# Patient Record
Sex: Female | Born: 1970
Health system: Southern US, Community
[De-identification: ages and names within clinical notes are randomized; demographics above are authoritative.]

## PROBLEM LIST (undated history)

## (undated) DIAGNOSIS — I10 Essential (primary) hypertension: Secondary | ICD-10-CM

## (undated) DIAGNOSIS — E079 Disorder of thyroid, unspecified: Secondary | ICD-10-CM

## (undated) DIAGNOSIS — J45909 Unspecified asthma, uncomplicated: Secondary | ICD-10-CM

## (undated) HISTORY — DX: Essential (primary) hypertension: I10

## (undated) HISTORY — PX: CHOLECYSTECTOMY: SHX55

---

## 2004-11-21 ENCOUNTER — Ambulatory Visit: Payer: Self-pay | Admitting: Internal Medicine

## 2004-11-26 ENCOUNTER — Ambulatory Visit: Payer: Self-pay | Admitting: Internal Medicine

## 2014-01-18 DIAGNOSIS — E042 Nontoxic multinodular goiter: Secondary | ICD-10-CM | POA: Insufficient documentation

## 2014-01-18 DIAGNOSIS — R5383 Other fatigue: Secondary | ICD-10-CM | POA: Insufficient documentation

## 2014-01-18 HISTORY — DX: Nontoxic multinodular goiter: E04.2

## 2014-01-18 HISTORY — DX: Other fatigue: R53.83

## 2014-03-08 DIAGNOSIS — Z87442 Personal history of urinary calculi: Secondary | ICD-10-CM | POA: Insufficient documentation

## 2014-03-08 DIAGNOSIS — Z9889 Other specified postprocedural states: Secondary | ICD-10-CM | POA: Insufficient documentation

## 2014-03-08 DIAGNOSIS — Z9049 Acquired absence of other specified parts of digestive tract: Secondary | ICD-10-CM | POA: Insufficient documentation

## 2014-03-08 HISTORY — DX: Acquired absence of other specified parts of digestive tract: Z90.49

## 2014-03-08 HISTORY — DX: Other specified postprocedural states: Z98.890

## 2014-03-08 HISTORY — DX: Personal history of urinary calculi: Z87.442

## 2015-01-30 DIAGNOSIS — M545 Low back pain, unspecified: Secondary | ICD-10-CM

## 2015-01-30 DIAGNOSIS — M533 Sacrococcygeal disorders, not elsewhere classified: Secondary | ICD-10-CM

## 2015-01-30 HISTORY — DX: Low back pain, unspecified: M54.50

## 2015-01-30 HISTORY — DX: Sacrococcygeal disorders, not elsewhere classified: M53.3

## 2015-02-21 DIAGNOSIS — M5126 Other intervertebral disc displacement, lumbar region: Secondary | ICD-10-CM | POA: Insufficient documentation

## 2015-02-21 DIAGNOSIS — M5127 Other intervertebral disc displacement, lumbosacral region: Secondary | ICD-10-CM

## 2015-02-21 HISTORY — DX: Other intervertebral disc displacement, lumbar region: M51.26

## 2015-02-21 HISTORY — DX: Other intervertebral disc displacement, lumbosacral region: M51.27

## 2015-12-03 DIAGNOSIS — R928 Other abnormal and inconclusive findings on diagnostic imaging of breast: Secondary | ICD-10-CM | POA: Insufficient documentation

## 2015-12-03 HISTORY — DX: Other abnormal and inconclusive findings on diagnostic imaging of breast: R92.8

## 2016-03-12 DIAGNOSIS — N921 Excessive and frequent menstruation with irregular cycle: Secondary | ICD-10-CM

## 2016-03-12 DIAGNOSIS — E079 Disorder of thyroid, unspecified: Secondary | ICD-10-CM | POA: Insufficient documentation

## 2016-03-12 DIAGNOSIS — N939 Abnormal uterine and vaginal bleeding, unspecified: Secondary | ICD-10-CM | POA: Insufficient documentation

## 2016-03-12 DIAGNOSIS — Z9889 Other specified postprocedural states: Secondary | ICD-10-CM | POA: Insufficient documentation

## 2016-03-12 DIAGNOSIS — N930 Postcoital and contact bleeding: Secondary | ICD-10-CM

## 2016-03-12 HISTORY — DX: Other specified postprocedural states: Z98.890

## 2016-03-12 HISTORY — DX: Disorder of thyroid, unspecified: E07.9

## 2016-03-12 HISTORY — DX: Postcoital and contact bleeding: N93.0

## 2016-03-12 HISTORY — DX: Abnormal uterine and vaginal bleeding, unspecified: N93.9

## 2016-03-12 HISTORY — DX: Excessive and frequent menstruation with irregular cycle: N92.1

## 2017-06-19 DIAGNOSIS — E063 Autoimmune thyroiditis: Secondary | ICD-10-CM | POA: Insufficient documentation

## 2017-06-19 HISTORY — DX: Autoimmune thyroiditis: E06.3

## 2017-11-09 DIAGNOSIS — N6012 Diffuse cystic mastopathy of left breast: Secondary | ICD-10-CM

## 2017-11-09 HISTORY — DX: Diffuse cystic mastopathy of left breast: N60.12

## 2018-07-09 DIAGNOSIS — J4521 Mild intermittent asthma with (acute) exacerbation: Secondary | ICD-10-CM | POA: Insufficient documentation

## 2018-07-09 DIAGNOSIS — E559 Vitamin D deficiency, unspecified: Secondary | ICD-10-CM | POA: Insufficient documentation

## 2018-07-09 HISTORY — DX: Vitamin D deficiency, unspecified: E55.9

## 2018-07-09 HISTORY — DX: Mild intermittent asthma with (acute) exacerbation: J45.21

## 2018-09-14 DIAGNOSIS — E063 Autoimmune thyroiditis: Secondary | ICD-10-CM | POA: Diagnosis not present

## 2018-09-14 DIAGNOSIS — I10 Essential (primary) hypertension: Secondary | ICD-10-CM | POA: Diagnosis not present

## 2018-09-14 DIAGNOSIS — I38 Endocarditis, valve unspecified: Secondary | ICD-10-CM | POA: Diagnosis not present

## 2018-10-05 MED FILL — LABETALOL HCL 100 MG TABS: 100 | 30 days supply | Qty: 270 | Fill #0

## 2018-10-07 MED FILL — NORETHINDRONE 5 MG TABLET: 5 | 30 days supply | Qty: 30 | Fill #0

## 2018-10-15 MED FILL — NORETHINDRONE 5 MG TABLET: 5 | 30 days supply | Qty: 30 | Fill #0

## 2018-10-15 MED FILL — LABETALOL HCL 100 MG TABS: 100 | 30 days supply | Qty: 270 | Fill #0

## 2018-10-19 DIAGNOSIS — Z1231 Encounter for screening mammogram for malignant neoplasm of breast: Secondary | ICD-10-CM | POA: Diagnosis not present

## 2018-10-19 DIAGNOSIS — Z1239 Encounter for other screening for malignant neoplasm of breast: Secondary | ICD-10-CM | POA: Diagnosis not present

## 2018-10-20 DIAGNOSIS — N921 Excessive and frequent menstruation with irregular cycle: Secondary | ICD-10-CM | POA: Diagnosis not present

## 2018-10-20 DIAGNOSIS — Z124 Encounter for screening for malignant neoplasm of cervix: Secondary | ICD-10-CM | POA: Diagnosis not present

## 2018-10-20 DIAGNOSIS — Z1151 Encounter for screening for human papillomavirus (HPV): Secondary | ICD-10-CM | POA: Diagnosis not present

## 2018-10-20 DIAGNOSIS — Z01419 Encounter for gynecological examination (general) (routine) without abnormal findings: Secondary | ICD-10-CM | POA: Diagnosis not present

## 2018-11-08 DIAGNOSIS — I38 Endocarditis, valve unspecified: Secondary | ICD-10-CM | POA: Diagnosis not present

## 2018-11-08 DIAGNOSIS — I10 Essential (primary) hypertension: Secondary | ICD-10-CM | POA: Diagnosis not present

## 2018-11-08 DIAGNOSIS — E063 Autoimmune thyroiditis: Secondary | ICD-10-CM | POA: Diagnosis not present

## 2018-11-12 MED FILL — NORETHINDRONE 5 MG TABLET: 5 | 90 days supply | Qty: 90 | Fill #0

## 2018-11-17 MED FILL — LABETALOL HCL 100 MG TABS: 100 | 30 days supply | Qty: 270 | Fill #1

## 2018-11-17 MED FILL — VIT D2 1.25 MG (50,000 UNIT: 1.25 MG | 28 days supply | Qty: 4 | Fill #0

## 2018-12-13 MED FILL — VIT D2 1.25 MG (50,000 UNIT: 1.25 MG | 28 days supply | Qty: 4 | Fill #1

## 2018-12-13 MED FILL — LABETALOL HCL 100 MG TABS: 100 | 30 days supply | Qty: 270 | Fill #2

## 2018-12-31 MED FILL — LABETALOL HCL 100 MG TABS: 100 | 30 days supply | Qty: 270 | Fill #3

## 2019-01-29 MED FILL — VIT D2 1.25 MG (50,000 UNIT: 1.25 MG | 28 days supply | Qty: 4 | Fill #0

## 2019-01-31 MED FILL — LABETALOL HCL 100 MG TABS: 100 | 30 days supply | Qty: 270 | Fill #4

## 2019-02-02 DIAGNOSIS — J90 Pleural effusion, not elsewhere classified: Secondary | ICD-10-CM | POA: Diagnosis not present

## 2019-02-02 DIAGNOSIS — R509 Fever, unspecified: Secondary | ICD-10-CM | POA: Diagnosis not present

## 2019-02-02 DIAGNOSIS — R0789 Other chest pain: Secondary | ICD-10-CM | POA: Diagnosis not present

## 2019-02-02 DIAGNOSIS — J4521 Mild intermittent asthma with (acute) exacerbation: Secondary | ICD-10-CM | POA: Diagnosis not present

## 2019-02-02 DIAGNOSIS — R109 Unspecified abdominal pain: Secondary | ICD-10-CM | POA: Diagnosis not present

## 2019-02-02 DIAGNOSIS — Z79899 Other long term (current) drug therapy: Secondary | ICD-10-CM | POA: Diagnosis not present

## 2019-02-02 DIAGNOSIS — R51 Headache: Secondary | ICD-10-CM | POA: Diagnosis not present

## 2019-02-02 DIAGNOSIS — R0602 Shortness of breath: Secondary | ICD-10-CM | POA: Diagnosis not present

## 2019-02-02 DIAGNOSIS — I10 Essential (primary) hypertension: Secondary | ICD-10-CM | POA: Diagnosis not present

## 2019-02-02 DIAGNOSIS — Z20828 Contact with and (suspected) exposure to other viral communicable diseases: Secondary | ICD-10-CM | POA: Diagnosis not present

## 2019-02-03 DIAGNOSIS — I10 Essential (primary) hypertension: Secondary | ICD-10-CM | POA: Diagnosis not present

## 2019-02-03 DIAGNOSIS — R0789 Other chest pain: Secondary | ICD-10-CM | POA: Diagnosis not present

## 2019-02-03 DIAGNOSIS — R079 Chest pain, unspecified: Secondary | ICD-10-CM | POA: Diagnosis not present

## 2019-02-03 DIAGNOSIS — Z79899 Other long term (current) drug therapy: Secondary | ICD-10-CM | POA: Diagnosis not present

## 2019-02-03 DIAGNOSIS — J90 Pleural effusion, not elsewhere classified: Secondary | ICD-10-CM | POA: Diagnosis not present

## 2019-02-03 DIAGNOSIS — Z20828 Contact with and (suspected) exposure to other viral communicable diseases: Secondary | ICD-10-CM | POA: Diagnosis not present

## 2019-02-03 DIAGNOSIS — R509 Fever, unspecified: Secondary | ICD-10-CM | POA: Diagnosis not present

## 2019-02-03 DIAGNOSIS — R0602 Shortness of breath: Secondary | ICD-10-CM | POA: Diagnosis not present

## 2019-02-03 DIAGNOSIS — J4521 Mild intermittent asthma with (acute) exacerbation: Secondary | ICD-10-CM | POA: Diagnosis not present

## 2019-02-03 MED FILL — FLOVENT HFA 44 MCG INHALER: 44 | 30 days supply | Qty: 11 | Fill #0

## 2019-02-03 MED FILL — predniSONE 20 MG TABS: 20 | 5 days supply | Qty: 5 | Fill #0

## 2019-02-14 MED FILL — NORETHINDRONE 5 MG TABLET: 5 | 90 days supply | Qty: 90 | Fill #1

## 2019-02-23 MED FILL — LABETALOL HCL 200 MG TABLET: 200 | 30 days supply | Qty: 180 | Fill #0

## 2019-03-21 MED FILL — LABETALOL HCL 200 MG TABLET: 200 | 30 days supply | Qty: 180 | Fill #0

## 2019-03-23 MED FILL — VIT D2 1.25 MG (50,000 UNIT: 1.25 MG | 28 days supply | Qty: 4 | Fill #1

## 2019-03-31 DIAGNOSIS — Z03818 Encounter for observation for suspected exposure to other biological agents ruled out: Secondary | ICD-10-CM | POA: Diagnosis not present

## 2019-04-04 DIAGNOSIS — Z760 Encounter for issue of repeat prescription: Secondary | ICD-10-CM | POA: Diagnosis not present

## 2019-04-04 DIAGNOSIS — E042 Nontoxic multinodular goiter: Secondary | ICD-10-CM | POA: Diagnosis not present

## 2019-04-04 DIAGNOSIS — R5383 Other fatigue: Secondary | ICD-10-CM | POA: Diagnosis not present

## 2019-04-04 DIAGNOSIS — Z6827 Body mass index (BMI) 27.0-27.9, adult: Secondary | ICD-10-CM | POA: Diagnosis not present

## 2019-04-04 DIAGNOSIS — R7989 Other specified abnormal findings of blood chemistry: Secondary | ICD-10-CM | POA: Diagnosis not present

## 2019-04-04 DIAGNOSIS — E559 Vitamin D deficiency, unspecified: Secondary | ICD-10-CM | POA: Diagnosis not present

## 2019-04-11 MED FILL — traMADol HCL 50 MG TABS: 50 | 1 days supply | Qty: 4 | Fill #0

## 2019-04-20 DIAGNOSIS — N926 Irregular menstruation, unspecified: Secondary | ICD-10-CM | POA: Diagnosis not present

## 2019-04-20 DIAGNOSIS — N946 Dysmenorrhea, unspecified: Secondary | ICD-10-CM | POA: Diagnosis not present

## 2019-04-20 DIAGNOSIS — G8929 Other chronic pain: Secondary | ICD-10-CM | POA: Diagnosis not present

## 2019-04-20 DIAGNOSIS — O10019 Pre-existing essential hypertension complicating pregnancy, unspecified trimester: Secondary | ICD-10-CM | POA: Diagnosis not present

## 2019-04-20 DIAGNOSIS — M549 Dorsalgia, unspecified: Secondary | ICD-10-CM | POA: Diagnosis not present

## 2019-04-22 DIAGNOSIS — N3091 Cystitis, unspecified with hematuria: Secondary | ICD-10-CM | POA: Diagnosis not present

## 2019-04-22 MED FILL — CIPROFLOXACIN HCL 500 MG TA: 500 | 7 days supply | Qty: 14 | Fill #0

## 2019-04-25 MED FILL — LABETALOL HCL 200 MG TABLET: 200 | 30 days supply | Qty: 180 | Fill #0

## 2019-05-10 DIAGNOSIS — I10 Essential (primary) hypertension: Secondary | ICD-10-CM | POA: Diagnosis not present

## 2019-05-10 DIAGNOSIS — I38 Endocarditis, valve unspecified: Secondary | ICD-10-CM | POA: Diagnosis not present

## 2019-05-10 DIAGNOSIS — E063 Autoimmune thyroiditis: Secondary | ICD-10-CM | POA: Diagnosis not present

## 2019-05-10 MED FILL — cloNIDine HCL 0.1 MG TABS: 0.1 | 30 days supply | Qty: 60 | Fill #0

## 2019-05-17 DIAGNOSIS — N898 Other specified noninflammatory disorders of vagina: Secondary | ICD-10-CM | POA: Diagnosis not present

## 2019-05-17 DIAGNOSIS — N951 Menopausal and female climacteric states: Secondary | ICD-10-CM | POA: Diagnosis not present

## 2019-05-17 DIAGNOSIS — R399 Unspecified symptoms and signs involving the genitourinary system: Secondary | ICD-10-CM | POA: Diagnosis not present

## 2019-05-17 DIAGNOSIS — N921 Excessive and frequent menstruation with irregular cycle: Secondary | ICD-10-CM | POA: Diagnosis not present

## 2019-05-17 DIAGNOSIS — N946 Dysmenorrhea, unspecified: Secondary | ICD-10-CM | POA: Diagnosis not present

## 2019-05-17 DIAGNOSIS — N926 Irregular menstruation, unspecified: Secondary | ICD-10-CM | POA: Diagnosis not present

## 2019-05-18 MED FILL — CEPHALEXIN 500 MG CAPSULE: 500 | 7 days supply | Qty: 21 | Fill #0

## 2019-05-23 MED FILL — VIT D2 1.25 MG (50,000 UNIT: 1.25 MG | 84 days supply | Qty: 12 | Fill #0

## 2019-05-24 MED FILL — LABETALOL HCL 200 MG TABLET: 200 | 30 days supply | Qty: 180 | Fill #0

## 2019-05-30 DIAGNOSIS — I340 Nonrheumatic mitral (valve) insufficiency: Secondary | ICD-10-CM | POA: Diagnosis not present

## 2019-06-02 DIAGNOSIS — E042 Nontoxic multinodular goiter: Secondary | ICD-10-CM | POA: Diagnosis not present

## 2019-06-08 DIAGNOSIS — R351 Nocturia: Secondary | ICD-10-CM | POA: Diagnosis not present

## 2019-06-08 DIAGNOSIS — R350 Frequency of micturition: Secondary | ICD-10-CM | POA: Diagnosis not present

## 2019-06-08 MED FILL — CIPROFLOXACIN HCL 500 MG TA: 500 | 7 days supply | Qty: 14 | Fill #0

## 2019-06-13 MED FILL — NORETHINDRONE 5 MG TABLET: 5 | 90 days supply | Qty: 90 | Fill #2

## 2019-06-20 MED FILL — LABETALOL HCL 200 MG TABLET: 200 | 30 days supply | Qty: 180 | Fill #1

## 2019-07-18 MED FILL — LABETALOL HCL 200 MG TABLET: 200 | 30 days supply | Qty: 180 | Fill #2

## 2019-08-15 DIAGNOSIS — E559 Vitamin D deficiency, unspecified: Secondary | ICD-10-CM | POA: Diagnosis not present

## 2019-08-15 DIAGNOSIS — E042 Nontoxic multinodular goiter: Secondary | ICD-10-CM | POA: Diagnosis not present

## 2019-08-15 DIAGNOSIS — Z760 Encounter for issue of repeat prescription: Secondary | ICD-10-CM | POA: Diagnosis not present

## 2019-08-15 MED FILL — VIT D2 1.25 MG (50,000 UNIT: 1.25 MG | 84 days supply | Qty: 12 | Fill #0

## 2019-08-17 MED FILL — LABETALOL HCL 200 MG TABS: 200 | 30 days supply | Qty: 180 | Fill #3

## 2019-08-17 MED FILL — cloNIDine HCL 0.1 MG TABS: 0.1 | 30 days supply | Qty: 60 | Fill #1

## 2019-08-18 MED FILL — PHENTERMINE 37.5 MG TABLET: 37.5 | 60 days supply | Qty: 60 | Fill #0

## 2019-08-25 DIAGNOSIS — Z20828 Contact with and (suspected) exposure to other viral communicable diseases: Secondary | ICD-10-CM | POA: Diagnosis not present

## 2019-08-25 DIAGNOSIS — B349 Viral infection, unspecified: Secondary | ICD-10-CM | POA: Diagnosis not present

## 2019-08-25 DIAGNOSIS — R079 Chest pain, unspecified: Secondary | ICD-10-CM | POA: Diagnosis not present

## 2019-08-25 DIAGNOSIS — J45909 Unspecified asthma, uncomplicated: Secondary | ICD-10-CM | POA: Diagnosis not present

## 2019-08-25 DIAGNOSIS — R072 Precordial pain: Secondary | ICD-10-CM | POA: Diagnosis not present

## 2019-08-25 DIAGNOSIS — R300 Dysuria: Secondary | ICD-10-CM | POA: Diagnosis not present

## 2019-08-25 DIAGNOSIS — R509 Fever, unspecified: Secondary | ICD-10-CM | POA: Diagnosis not present

## 2019-08-25 DIAGNOSIS — R5383 Other fatigue: Secondary | ICD-10-CM | POA: Diagnosis not present

## 2019-08-25 DIAGNOSIS — R0602 Shortness of breath: Secondary | ICD-10-CM | POA: Diagnosis not present

## 2019-08-26 ENCOUNTER — Other Ambulatory Visit: Payer: Self-pay

## 2019-08-26 DIAGNOSIS — B349 Viral infection, unspecified: Secondary | ICD-10-CM | POA: Insufficient documentation

## 2019-08-26 DIAGNOSIS — R7989 Other specified abnormal findings of blood chemistry: Secondary | ICD-10-CM | POA: Diagnosis not present

## 2019-08-26 DIAGNOSIS — R5383 Other fatigue: Secondary | ICD-10-CM | POA: Diagnosis not present

## 2019-08-26 DIAGNOSIS — R072 Precordial pain: Secondary | ICD-10-CM | POA: Diagnosis not present

## 2019-08-26 DIAGNOSIS — J45909 Unspecified asthma, uncomplicated: Secondary | ICD-10-CM | POA: Insufficient documentation

## 2019-08-26 DIAGNOSIS — R0602 Shortness of breath: Secondary | ICD-10-CM | POA: Diagnosis not present

## 2019-08-26 DIAGNOSIS — R079 Chest pain, unspecified: Secondary | ICD-10-CM | POA: Diagnosis not present

## 2019-08-26 DIAGNOSIS — R05 Cough: Secondary | ICD-10-CM | POA: Diagnosis not present

## 2019-08-26 DIAGNOSIS — R509 Fever, unspecified: Secondary | ICD-10-CM | POA: Diagnosis not present

## 2019-08-26 NOTE — ED Triage Notes (Signed)
Pt. Said she has had chest pain for 2-3 days and is on diet supplement and HTN meds  With her B/P still going up.  Pt. Having chest pressure with her breathing feeling not.   Yesterday she had EKG with abnormalities per urgent care but normal one at [facility] last night.  Went to ED at [facility] due to feeling the Chest Pressure again and getting worse.  Troponin done on Pt. X 2 were WNL.  No D Dimer done on the Pt.

## 2019-08-27 ENCOUNTER — Emergency Department (HOSPITAL_BASED_OUTPATIENT_CLINIC_OR_DEPARTMENT_OTHER)
Admission: EM | Admit: 2019-08-27 | Discharge: 2019-08-27 | Disposition: A | Discharge status: Home / Self Care | Payer: 59 | Attending: Emergency Medicine | Admitting: Emergency Medicine

## 2019-08-27 ENCOUNTER — Emergency Department (HOSPITAL_BASED_OUTPATIENT_CLINIC_OR_DEPARTMENT_OTHER): Payer: 59

## 2019-08-27 ENCOUNTER — Encounter (HOSPITAL_BASED_OUTPATIENT_CLINIC_OR_DEPARTMENT_OTHER): Payer: Self-pay | Admitting: *Deleted

## 2019-08-27 DIAGNOSIS — B349 Viral infection, unspecified: Secondary | ICD-10-CM

## 2019-08-27 DIAGNOSIS — J45909 Unspecified asthma, uncomplicated: Secondary | ICD-10-CM | POA: Diagnosis not present

## 2019-08-27 DIAGNOSIS — R079 Chest pain, unspecified: Secondary | ICD-10-CM | POA: Diagnosis not present

## 2019-08-27 DIAGNOSIS — R05 Cough: Secondary | ICD-10-CM | POA: Diagnosis not present

## 2019-08-27 DIAGNOSIS — R0602 Shortness of breath: Secondary | ICD-10-CM | POA: Diagnosis not present

## 2019-08-27 DIAGNOSIS — R7989 Other specified abnormal findings of blood chemistry: Secondary | ICD-10-CM | POA: Diagnosis not present

## 2019-08-27 HISTORY — DX: Unspecified asthma, uncomplicated: J45.909

## 2019-08-27 HISTORY — DX: Disorder of thyroid, unspecified: E07.9

## 2019-08-27 LAB — CBC WITH DIFFERENTIAL/PLATELET
Abs Immature Granulocytes: 0.02 10*3/uL (ref 0.00–0.07)
Basophils Absolute: 0.1 10*3/uL (ref 0.0–0.1)
Basophils Relative: 1 %
Eosinophils Absolute: 0.2 10*3/uL (ref 0.0–0.5)
Eosinophils Relative: 3 %
HCT: 39.4 % (ref 36.0–46.0)
Hemoglobin: 13 g/dL (ref 12.0–15.0)
Immature Granulocytes: 0 %
Lymphocytes Relative: 27 %
Lymphs Abs: 1.9 10*3/uL (ref 0.7–4.0)
MCH: 28.6 pg (ref 26.0–34.0)
MCHC: 33 g/dL (ref 30.0–36.0)
MCV: 86.8 fL (ref 80.0–100.0)
Monocytes Absolute: 0.7 10*3/uL (ref 0.1–1.0)
Monocytes Relative: 9 %
Neutro Abs: 4.4 10*3/uL (ref 1.7–7.7)
Neutrophils Relative %: 60 %
Platelets: 218 10*3/uL (ref 150–400)
RBC: 4.54 MIL/uL (ref 3.87–5.11)
RDW: 13 % (ref 11.5–15.5)
WBC: 7.2 10*3/uL (ref 4.0–10.5)
nRBC: 0 % (ref 0.0–0.2)

## 2019-08-27 LAB — COMPREHENSIVE METABOLIC PANEL
ALT: 27 U/L (ref 0–44)
AST: 20 U/L (ref 15–41)
Albumin: 4.1 g/dL (ref 3.5–5.0)
Alkaline Phosphatase: 53 U/L (ref 38–126)
Anion gap: 7 (ref 5–15)
BUN: 16 mg/dL (ref 6–20)
CO2: 21 mmol/L — ABNORMAL LOW (ref 22–32)
Calcium: 9.2 mg/dL (ref 8.9–10.3)
Chloride: 109 mmol/L (ref 98–111)
Creatinine, Ser: 0.73 mg/dL (ref 0.44–1.00)
GFR calc Af Amer: >60 mL/min (ref >60)
GFR calc non Af Amer: >60 mL/min (ref >60)
Glucose, Bld: 118 mg/dL — ABNORMAL HIGH (ref 70–99)
Potassium: 4.1 mmol/L (ref 3.5–5.1)
Sodium: 137 mmol/L (ref 135–145)
Total Bilirubin: 0.4 mg/dL (ref 0.3–1.2)
Total Protein: 7.1 g/dL (ref 6.5–8.1)

## 2019-08-27 LAB — LIPASE, BLOOD: Lipase: 33 U/L (ref 11–51)

## 2019-08-27 LAB — TROPONIN I (HIGH SENSITIVITY): Troponin I (High Sensitivity): <2 ng/L (ref <18)

## 2019-08-27 LAB — D-DIMER, QUANTITATIVE: D-Dimer, Quant: 1.28 ug/mL-FEU — ABNORMAL HIGH (ref 0.00–0.50)

## 2019-08-27 MED ORDER — IOHEXOL 350 MG/ML SOLN
100.0000 mL | Freq: Once | INTRAVENOUS | Status: AC | PRN
  Administered 2019-08-27: 100 mL via INTRAVENOUS

## 2019-08-27 MED ORDER — DEXAMETHASONE 6 MG PO TABS
10.0000 mg | ORAL_TABLET | Freq: Once | ORAL | Status: AC
  Administered 2019-08-27: 10 mg via ORAL
  Filled 2019-08-27: qty 1

## 2019-08-27 NOTE — ED Notes (Signed)
ED Provider at bedside. 

## 2019-08-27 NOTE — Discharge Instructions (Signed)
Take tylenol 2 pills 4 times a day and motrin 4 pills 3 times a day.  Drink plenty of fluids.  Return for worsening shortness of breath, headache, confusion. Follow up with your family doctor.      Person Under Monitoring Name: Lindsay Burke  Location: [address] [place] [address]   Infection Prevention Recommendations for Individuals Confirmed to have, or Being Evaluated for, 2019 Novel Coronavirus (COVID-19) Infection Who Receive Care at Home  Individuals who are confirmed to have, or are being evaluated for, COVID-19 should follow the prevention steps below until a healthcare provider or local or state health department says they can return to normal activities.  Stay home except to get medical care You should restrict activities outside your home, except for getting medical care. Do not go to work, school, or public areas, and do not use public transportation or taxis.  Call ahead before visiting your doctor Before your medical appointment, call the healthcare provider and tell them that you have, or are being evaluated for, COVID-19 infection. This will help the healthcare providers office take steps to keep other people from getting infected. Ask your healthcare provider to call the local or state health department.  Monitor your symptoms Seek prompt medical attention if your illness is worsening (e.g., difficulty breathing). Before going to your medical appointment, call the healthcare provider and tell them that you have, or are being evaluated for, COVID-19 infection. Ask your healthcare provider to call the local or state health department.  Wear a facemask You should wear a facemask that covers your nose and mouth when you are in the same room with other people and when you visit a healthcare provider. People who live with or visit you should also wear a facemask while they are in the same room with you.  Separate yourself from other people in your home As  much as possible, you should stay in a different room from other people in your home. Also, you should use a separate bathroom, if available.  Avoid sharing household items You should not share dishes, drinking glasses, cups, eating utensils, towels, bedding, or other items with other people in your home. After using these items, you should wash them thoroughly with soap and water.  Cover your coughs and sneezes Cover your mouth and nose with a tissue when you cough or sneeze, or you can cough or sneeze into your sleeve. Throw used tissues in a lined trash can, and immediately wash your hands with soap and water for at least 20 seconds or use an alcohol-based hand rub.  Wash your [facility] your hands often and thoroughly with soap and water for at least 20 seconds. You can use an alcohol-based hand sanitizer if soap and water are not available and if your hands are not visibly dirty. Avoid touching your eyes, nose, and mouth with unwashed hands.   Prevention Steps for Caregivers and Household Members of Individuals Confirmed to have, or Being Evaluated for, COVID-19 Infection Being Cared for in the Home  If you live with, or provide care at home for, a person confirmed to have, or being evaluated for, COVID-19 infection please follow these guidelines to prevent infection:  Follow healthcare providers instructions Make sure that you understand and can help the patient follow any healthcare provider instructions for all care.  Provide for the patients basic needs You should help the patient with basic needs in the home and provide support for getting groceries, prescriptions, and  other personal needs.  Monitor the patients symptoms If they are getting sicker, call his or her medical provider and tell them that the patient has, or is being evaluated for, COVID-19 infection. This will help the healthcare providers office take steps to keep other people from getting infected. Ask  the healthcare provider to call the local or state health department.  Limit the number of people who have contact with the patient If possible, have only one caregiver for the patient. Other household members should stay in another home or place of residence. If this is not possible, they should stay in another room, or be separated from the patient as much as possible. Use a separate bathroom, if available. Restrict visitors who do not have an essential need to be in the home.  Keep older adults, very young children, and other sick people away from the patient Keep older adults, very young children, and those who have compromised immune systems or chronic health conditions away from the patient. This includes people with chronic heart, lung, or kidney conditions, diabetes, and cancer.  Ensure good ventilation Make sure that shared spaces in the home have good air flow, such as from an air conditioner or an opened window, weather permitting.  Wash your hands often Wash your hands often and thoroughly with soap and water for at least 20 seconds. You can use an alcohol based hand sanitizer if soap and water are not available and if your hands are not visibly dirty. Avoid touching your eyes, nose, and mouth with unwashed hands. Use disposable paper towels to dry your hands. If not available, use dedicated cloth towels and replace them when they become wet.  Wear a facemask and gloves Wear a disposable facemask at all times in the room and gloves when you touch or have contact with the patients blood, body fluids, and/or secretions or excretions, such as sweat, saliva, sputum, nasal mucus, vomit, urine, or feces.  Ensure the mask fits over your nose and mouth tightly, and do not touch it during use. Throw out disposable facemasks and gloves after using them. Do not reuse. Wash your hands immediately after removing your facemask and gloves. If your personal clothing becomes contaminated,  carefully remove clothing and launder. Wash your hands after handling contaminated clothing. Place all used disposable facemasks, gloves, and other waste in a lined container before disposing them with other household waste. Remove gloves and wash your hands immediately after handling these items.  Do not share dishes, glasses, or other household items with the patient Avoid sharing household items. You should not share dishes, drinking glasses, cups, eating utensils, towels, bedding, or other items with a patient who is confirmed to have, or being evaluated for, COVID-19 infection. After the person uses these items, you should wash them thoroughly with soap and water.  Wash laundry thoroughly Immediately remove and wash clothes or bedding that have blood, body fluids, and/or secretions or excretions, such as sweat, saliva, sputum, nasal mucus, vomit, urine, or feces, on them. Wear gloves when handling laundry from the patient. Read and follow directions on labels of laundry or clothing items and detergent. In general, wash and dry with the warmest temperatures recommended on the label.  Clean all areas the individual has used often Clean all touchable surfaces, such as counters, tabletops, doorknobs, bathroom fixtures, toilets, phones, keyboards, tablets, and bedside tables, every day. Also, clean any surfaces that may have blood, body fluids, and/or secretions or excretions on them. Wear gloves when cleaning  surfaces the patient has come in contact with. Use a diluted bleach solution (e.g., dilute bleach with 1 part bleach and 10 parts water) or a household disinfectant with a label that says EPA-registered for coronaviruses. To make a bleach solution at home, add 1 tablespoon of bleach to 1 quart (4 cups) of water. For a larger supply, add  cup of bleach to 1 gallon (16 cups) of water. Read labels of cleaning products and follow recommendations provided on product labels. Labels contain  instructions for safe and effective use of the cleaning product including precautions you should take when applying the product, such as wearing gloves or eye protection and making sure you have good ventilation during use of the product. Remove gloves and wash hands immediately after cleaning.  Monitor yourself for signs and symptoms of illness Caregivers and household members are considered close contacts, should monitor their health, and will be asked to limit movement outside of the home to the extent possible. Follow the monitoring steps for close contacts listed on the symptom monitoring form.   ? If you have additional questions, contact your local health department or call the epidemiologist on call at [phone] (available 24/7). ? This guidance is subject to change. For the most up-to-date guidance from [facility], please refer to their website: [employer]Blogs[employer]pl

## 2019-08-27 NOTE — ED Provider Notes (Signed)
[facility] Provider Note   CSN: [id number] Arrival date & time: 08/26/19  2350     History   Chief Complaint Chief Complaint  Patient presents with  . Chest Pain    HPI Lindsay Burke is a 48 y.o. female.     48 yo F with a chief complaints of chest pain or shortness of breath.  Going on for about 48 hours.  Started with viral-like illness for about 4 days.  Multiple members of her family have had a similar illness.  Diffuse body aches.  No coughing nausea vomiting or diarrhea.  The history is provided by the patient.  Chest Pain Pain location:  Substernal area Pain quality: aching   Pain radiates to:  Does not radiate Pain severity:  Moderate Onset quality:  Gradual Duration:  2 days Timing:  Constant Progression:  Unchanged Chronicity:  New Relieved by:  Nothing Worsened by:  Nothing Ineffective treatments:  None tried Associated symptoms: shortness of breath   Associated symptoms: no dizziness, no fever, no headache, no nausea, no palpitations and no vomiting     Past Medical History:  Diagnosis Date  . Asthma   . Thyroid disease     There are no active problems to display for this patient.   Past Surgical History:  Procedure Laterality Date  . CHOLECYSTECTOMY       OB History   No obstetric history on file.      Home Medications    Prior to Admission medications   Not on File    Family History No family history on file.  Social History Social History   Tobacco Use  . Smoking status: Never Smoker  . Smokeless tobacco: Never Used  Substance Use Topics  . Alcohol use: Not on file  . Drug use: Not on file     Allergies   Patient has no allergy information on record.   Review of Systems Review of Systems  Constitutional: Negative for chills and fever.  HENT: Negative for congestion and rhinorrhea.   Eyes: Negative for redness and visual disturbance.  Respiratory: Positive for shortness of breath.  Negative for wheezing.   Cardiovascular: Positive for chest pain. Negative for palpitations.  Gastrointestinal: Negative for nausea and vomiting.  Genitourinary: Negative for dysuria and urgency.  Musculoskeletal: Negative for arthralgias and myalgias.  Skin: Negative for pallor and wound.  Neurological: Negative for dizziness and headaches.     Physical Exam Updated Vital Signs BP (!) 142/86   Pulse 71   Temp 98.4 F (36.9 C) (Oral)   Resp (!) 21   Ht 5\' 4"  (1.626 m)   Wt 68.5 kg   LMP  (LMP Unknown) Comment: using progesterone  SpO2 100%   BMI 25.92 kg/m   Physical Exam Vitals signs and nursing note reviewed.  Constitutional:      General: She is not in acute distress.    Appearance: She is well-developed. She is not diaphoretic.  HENT:     Head: Normocephalic and atraumatic.  Eyes:     Pupils: Pupils are equal, round, and reactive to light.  Neck:     Musculoskeletal: Normal range of motion and neck supple.  Cardiovascular:     Rate and Rhythm: Normal rate and regular rhythm.     Heart sounds: No murmur. No friction rub. No gallop.   Pulmonary:     Effort: Pulmonary effort is normal.     Breath sounds: No wheezing or rales.  Abdominal:  General: There is no distension.     Palpations: Abdomen is soft.     Tenderness: There is no abdominal tenderness.  Musculoskeletal:        General: No tenderness.  Skin:    General: Skin is warm and dry.  Neurological:     Mental Status: She is alert and oriented to person, place, and time.  Psychiatric:        Behavior: Behavior normal.      ED Treatments / Results  Labs (all labs ordered are listed, but only abnormal results are displayed) Labs Reviewed  COMPREHENSIVE METABOLIC PANEL - Abnormal; Notable for the following components:      Result Value   CO2 21 (*)    Glucose, Bld 118 (*)    All other components within normal limits  D-DIMER, QUANTITATIVE (NOT AT [facility]) - Abnormal; Notable for the following  components:   D-Dimer, Quant 1.28 (*)    All other components within normal limits  CBC WITH DIFFERENTIAL/PLATELET  LIPASE, BLOOD  TROPONIN I (HIGH SENSITIVITY)    EKG EKG Interpretation  Date/Time:  Friday August 26 2019 23:59:17 EST Ventricular Rate:  82 PR Interval:  156 QRS Duration: 74 QT Interval:  362 QTC Calculation: 422 R Axis:   25 Text Interpretation: Normal sinus rhythm Normal ECG No old tracing to compare Confirmed by Deno Etienne [phone]) on 08/27/2019 12:03:16 AM   Radiology Ct Angio Chest Pe W And/or Wo Contrast  Result Date: 08/27/2019 CLINICAL DATA:  Positive D-dimer.  Chest pain EXAM: CT ANGIOGRAPHY CHEST WITH CONTRAST TECHNIQUE: Multidetector CT imaging of the chest was performed using the standard protocol during bolus administration of intravenous contrast. Multiplanar CT image reconstructions and MIPs were obtained to evaluate the vascular anatomy. CONTRAST:  151mL OMNIPAQUE IOHEXOL 350 MG/ML SOLN COMPARISON:  02/03/2019 FINDINGS: Cardiovascular: Heart is normal size. Aorta is normal caliber. No filling defects in the pulmonary arteries to suggestpulmonary emboli. Mediastinum/Nodes: No mediastinal, hilar, or axillary adenopathy. Trachea and esophagus are unremarkable. Thyroid unremarkable. Lungs/Pleura: Trace bilateral pleural effusions. No confluent opacities. Upper Abdomen: Imaging into the upper abdomen shows no acute findings. Musculoskeletal: Chest wall soft tissues are unremarkable. No acute bony abnormality. Review of the MIP images confirms the above findings. IMPRESSION: No evidence of pulmonary embolus. Trace bilateral pleural effusions. Electronically Signed   By: Rolm Baptise M.D.   On: 08/27/2019 03:19   Dg Chest Port 1 View  Result Date: 08/27/2019 CLINICAL DATA:  Chest pressure, cough, shortness of breath EXAM: PORTABLE CHEST 1 VIEW COMPARISON:  08/25/2019 FINDINGS: Heart and mediastinal contours are within normal limits. No focal opacities or  effusions. No acute bony abnormality. IMPRESSION: No active disease. Electronically Signed   By: Rolm Baptise M.D.   On: 08/27/2019 01:50    Procedures Procedures (including critical care time)  Medications Ordered in ED Medications  iohexol (OMNIPAQUE) 350 MG/ML injection 100 mL (100 mLs Intravenous Contrast Given 08/27/19 0301)  dexamethasone (DECADRON) tablet 10 mg (10 mg Oral Given 08/27/19 0354)     Initial Impression / Assessment and Plan / ED Course  I have reviewed the triage vital signs and the nursing notes.  Pertinent labs & imaging results that were available during my care of the patient were reviewed by me and considered in my medical decision making (see chart for details).        48 yo F with a chief complaint of shortness of breath and chest pain.  Occurred with a viral-like illness.  Patient was  seen less than 24 hours ago at another ED.  There was some concern from her that she may have a pulmonary embolism, D-dimer here is positive.  Will obtain a CT angiogram of the chest.  CT scan is negative for acute pulmonary embolism.  She does have trace pleural effusions.  Patient story is most concerning for viral-like illness.  She does feel that she has some response to albuterol.  Will give 1 dose of Decadron here.  She had an outpatient Covid test that was acquired yesterday.  Would not retest today.  Troponin is negative.  She had 2 - troponins done within 48 hours I not feel this needs to be repeated.  The patient is 100% on room air, without tachypnea negative troponin and no signs of heart failure on exam.  Feel this is unlikely to be acute myo or pericarditis.   Stewart Hildenbrand was evaluated in Emergency Department on 08/27/2019 for the symptoms described in the history of present illness. He/she was evaluated in the context of the global COVID-19 pandemic, which necessitated consideration that the patient might be at risk for infection with the SARS-CoV-2 virus that  causes COVID-19. Institutional protocols and algorithms that pertain to the evaluation of patients at risk for COVID-19 are in a state of rapid change based on information released by regulatory bodies including the CDC and federal and state organizations. These policies and algorithms were followed during the patient's care in the ED.   4:00 AM:  I have discussed the diagnosis/risks/treatment options with the patient and believe the pt to be eligible for discharge home to follow-up with PCP. We also discussed returning to the ED immediately if new or worsening sx occur. We discussed the sx which are most concerning (e.g., sudden worsening pain, fever, inability to tolerate by mouth) that necessitate immediate return. Medications administered to the patient during their visit and any new prescriptions provided to the patient are listed below.  Medications given during this visit Medications  iohexol (OMNIPAQUE) 350 MG/ML injection 100 mL (100 mLs Intravenous Contrast Given 08/27/19 0301)  dexamethasone (DECADRON) tablet 10 mg (10 mg Oral Given 08/27/19 0354)     The patient appears reasonably screen and/or stabilized for discharge and I doubt any other medical condition or other [facility] requiring further screening, evaluation, or treatment in the ED at this time prior to discharge.    Final Clinical Impressions(s) / ED Diagnoses   Final diagnoses:  Shortness of breath  Viral syndrome    ED Discharge Orders    None       Deno Etienne, DO 08/27/19 0400

## 2019-08-27 NOTE — ED Notes (Signed)
Patient transported to CT 

## 2019-08-28 DIAGNOSIS — R079 Chest pain, unspecified: Secondary | ICD-10-CM | POA: Diagnosis not present

## 2019-08-29 DIAGNOSIS — E063 Autoimmune thyroiditis: Secondary | ICD-10-CM | POA: Diagnosis not present

## 2019-08-29 DIAGNOSIS — I10 Essential (primary) hypertension: Secondary | ICD-10-CM | POA: Diagnosis not present

## 2019-08-29 DIAGNOSIS — I38 Endocarditis, valve unspecified: Secondary | ICD-10-CM | POA: Diagnosis not present

## 2019-08-29 DIAGNOSIS — R0789 Other chest pain: Secondary | ICD-10-CM | POA: Diagnosis not present

## 2019-08-29 DIAGNOSIS — J4520 Mild intermittent asthma, uncomplicated: Secondary | ICD-10-CM | POA: Diagnosis not present

## 2019-08-29 MED FILL — predniSONE 10 MG TABS: 10 | 6 days supply | Qty: 21 | Fill #0

## 2019-09-01 MED FILL — ALBUTEROL SULFATE HFA 108 (: 108 (90 BAS | 17 days supply | Qty: 18 | Fill #0

## 2019-09-04 NOTE — Progress Notes (Signed)
Cardiology Office Note:    Date:  09/05/2019   ID:  Lindsay Burke, DOB 31-Mar-1971, MRN [id number]  PCP:  Deborah Chalk, FNP  Cardiologist:  Shirlee More, MD    Referring MD: Deborah Chalk, FNP    ASSESSMENT:    No diagnosis found. PLAN:    In order of problems listed above:  1. Essential hypertension, poorly controlled and with significant side effects from current treatment reduce clonidine to bedtime once daily initiate distal diuretic spironolactone hydralazine and will try to decrease the dose of labetalol and once her blood pressure is better controlled transition to better tolerated carvedilol.  Follow frequent blood pressures at home she has good knowledge and technique and uses an arm cuff. 2. Malaise and fatigue related to combined clonidine high-dose labetalol reduce clonidine to once daily at bedtime and we will initiate alternate antihypertensive therapy and wean and ultimately transition from labetalol to better tolerated beta-blocker. 3. Cardio vascular counseling, check lipoprotein a and cardiac CT score and if score is greater than 100 need an ischemia evaluation. 4. Snoring at risk for him undergo sleep study for obstructive sleep apnea   Next appointment: 2 weeks   Medication Adjustments/Labs and Tests Ordered: Current medicines are reviewed at length with the patient today.  Concerns regarding medicines are outlined above.  No orders of the defined types were placed in this encounter.  No orders of the defined types were placed in this encounter.   Chief Complaint  Patient presents with   Chest Pain    History of Present Illness:    Lindsay Burke is a 48 y.o. female with a hx of asthma thyroid disease hypertension and mitral valve prolapse referred for consultation after ED visit [facility] 08/25/2019 with chest pain and abnormal EKG.  She had 2 normal high-sensitivity troponins and also had normal troponin I in April 2020.   Other studies performed showed normal renal function GFR 77 cc normal electrolytes potassium 4.2 CBC normal hemoglobin 13.7, chest x-ray was read as no active disease.  A CTA of the chest 02/03/2019 showed normal cardiovascular structures bibasilar atelectasis and small pleural effusions and no evidence of pulmonary embolism..  There is no official report but there is a description of her EKG as being normal with no evidence of myocarditis or pericarditis.  Her blood pressure was labile and had an evaluation excluding pheochromocytoma or carcinoid tumor in his records have no notation of mitral valve prolapse.  She has had 2 cardiac echoes performed in 2016 2019 but unfortunately cannot access these records.  Does not appear that she has had any ischemia evaluation performed.  A lipid profile from 07/09/2018 shows total cholesterol 162 HDL 41 triglyceride 83 and LDL direct 131.  Compliance with diet, lifestyle and medications: Yes  His multiple she does have a history of snoring and has not had a sleep study goals for the visit one is to follow-up from the emergency room the second is controlled hypertension and the third is cardiovascular counseling with family history of premature CAD.  The time of her ED visit she had a Covid or flulike illness with cough and shortness of breath which has resolved.  She is 13 days out and would like to have convalescent antibody performed.  Since then her blood pressure has been difficult to control with systolics greater than [id number] and resting heart rates greater than 120.  She takes high-dose labetalol and was taking clonidine twice a day and had  overwhelming malaise and fatigue systolic blood pressures that were low on heart rates that were low.  She is taking clonidine once a day now and she has labile hypertension at home.  Onset of hypertension 12 years ago with preeclampsia of pregnancy.  Initially she was placed on Procardia and had a hypertensive crisis and has  been maintained on labetalol since that time.  Thiazide diuretic was ineffective.  ARB caused a cough.  He feels terribly with her current regimen with marked malaise and fatigue.  Is at risk for him undergo a sleep study.  Streamline hypertensive therapy I will place her on a distal diuretic spironolactone 1 week check renal function potassium.  She also undergo a sleep study.  I will place her on a vasodilator hydralazine as she will be on a diuretic and beta-blocker I told her she has systolics less than [id number] reduce her labetalol twice a day and once her blood pressure is better controlled transition her to carvedilol.  Also take her clonidine at bedtime.  He is concerned with her father having the onset of heart disease in his mid 7s.  Her lipid profile is favorable.  She will have an LP(a) level done and should be set up for a coronary calcium score if it is greater than 100 she will require an ischemia evaluation likely CTA.  Comfortable with the approach above.  She has no known history of congenital rheumatic heart disease and has been evaluated for pheochromocytoma and renal artery stenosis Past Medical History:  Diagnosis Date   Asthma    Hypertension    Thyroid disease     Past Surgical History:  Procedure Laterality Date   CHOLECYSTECTOMY      Current Medications: Current Meds  Medication Sig   acetaminophen (TYLENOL) 500 MG tablet Take 1 tablet by mouth as needed.   albuterol (VENTOLIN HFA) 108 (90 Base) MCG/ACT inhaler Inhale 2 puffs into the lungs every 4 (four) hours as needed.   cetirizine (ZYRTEC) 10 MG tablet Take 5 mg by mouth daily.   cloNIDine (CATAPRES) 0.1 MG tablet Take 0.1 tablets by mouth daily.   fluticasone (FLONASE) 50 MCG/ACT nasal spray SPRAY 1 SPRAY INTO EACH NOSTRIL EVERY DAY   fluticasone (FLOVENT HFA) 44 MCG/ACT inhaler Inhale 2 puffs into the lungs 2 (two) times daily.   labetalol (NORMODYNE) 200 MG tablet TAKE 2 TABLETS BY MOUTH THREE TIMES  DAILY   norethindrone (AYGESTIN) 5 MG tablet Take 5 mg by mouth at bedtime.   Vitamin D, Ergocalciferol, (DRISDOL) 1.25 MG (50000 UT) CAPS capsule TAKE 1 CAPSULE BY MOUTH ONCE A WEEK     Allergies:   Acetaminophen, Clindamycin/lincomycin, Chlorthalidone, and Nifedipine   Social History   Socioeconomic History   Marital status: Married    Spouse name: Not on file   Number of children: Not on file   Years of education: Not on file   Highest education level: Not on file  Occupational History   Not on file  Social Needs   Financial resource strain: Not on file   Food insecurity    Worry: Not on file    Inability: Not on file   Transportation needs    Medical: Not on file    Non-medical: Not on file  Tobacco Use   Smoking status: Never Smoker   Smokeless tobacco: Never Used  Substance and Sexual Activity   Alcohol use: Never    Frequency: Never   Drug use: Never   Sexual activity: Not  on file    Comment: progestrone  Lifestyle   Physical activity    Days per week: Not on file    Minutes per session: Not on file   Stress: Not on file  Relationships   Social connections    Talks on phone: Not on file    Gets together: Not on file    Attends religious service: Not on file    Active member of club or organization: Not on file    Attends meetings of clubs or organizations: Not on file    Relationship status: Not on file  Other Topics Concern   Not on file  Social History Narrative   Not on file     Family History: The patient's family history includes Heart attack in her father; Hypertension in her father and mother. ROS:  Review of Systems  Constitution: Positive for malaise/fatigue.  HENT: Negative.   Eyes: Negative.   Cardiovascular: Positive for dyspnea on exertion.  Respiratory: Positive for cough and wheezing.   Endocrine: Negative.   Hematologic/Lymphatic: Negative.   Skin: Negative.   Gastrointestinal: Negative.   Genitourinary:  Negative.   Psychiatric/Behavioral: Negative.   Allergic/Immunologic: Negative.    Please see the history of present illness.    All other systems reviewed and are negative.  EKGs/Labs/Other Studies Reviewed:    The following studies were reviewed today:   Recent Labs: 08/27/2019: ALT 27; BUN 16; Creatinine, Ser 0.73; Hemoglobin 13.0; Platelets 218; Potassium 4.1; Sodium 137  Recent Lipid Panel No results found for: CHOL, TRIG, HDL, CHOLHDL, VLDL, LDLCALC, LDLDIRECT  Physical Exam:    VS:  BP 136/90 (BP Location: Right Arm, Patient Position: Sitting, Cuff Size: Normal)    Pulse 84    Ht 5\' 4"  (1.626 m)    Wt 161 lb (73 kg)    LMP  (LMP Unknown) Comment: using progesterone   SpO2 98%    BMI 27.64 kg/m     Wt Readings from Last 3 Encounters:  09/05/19 161 lb (73 kg)  08/27/19 151 lb (68.5 kg)     GEN:  Well nourished, well developed in no acute distress HEENT: Normal NECK: No JVD; No carotid bruits LYMPHATICS: No lymphadenopathy CARDIAC: RRR, no murmurs, rubs, gallops RESPIRATORY:  Clear to auscultation without rales, wheezing or rhonchi  ABDOMEN: Soft, non-tender, non-distended MUSCULOSKELETAL:  No edema; No deformity  SKIN: Warm and dry NEUROLOGIC:  Alert and oriented x 3 PSYCHIATRIC:  Normal affect    Signed, Shirlee More, MD  09/05/2019 11:55 AM    [place]

## 2019-09-05 ENCOUNTER — Encounter: Payer: Self-pay | Admitting: Cardiology

## 2019-09-05 ENCOUNTER — Other Ambulatory Visit: Payer: Self-pay

## 2019-09-05 ENCOUNTER — Ambulatory Visit (INDEPENDENT_AMBULATORY_CARE_PROVIDER_SITE_OTHER): Payer: 59 | Admitting: Cardiology

## 2019-09-05 ENCOUNTER — Telehealth: Payer: Self-pay | Admitting: *Deleted

## 2019-09-05 VITALS — BP 136/90 | HR 84 | Ht 64.0 in | Wt 161.0 lb

## 2019-09-05 DIAGNOSIS — Z20828 Contact with and (suspected) exposure to other viral communicable diseases: Secondary | ICD-10-CM

## 2019-09-05 DIAGNOSIS — R0683 Snoring: Secondary | ICD-10-CM

## 2019-09-05 DIAGNOSIS — R5383 Other fatigue: Secondary | ICD-10-CM | POA: Insufficient documentation

## 2019-09-05 DIAGNOSIS — Z8249 Family history of ischemic heart disease and other diseases of the circulatory system: Secondary | ICD-10-CM

## 2019-09-05 DIAGNOSIS — R5381 Other malaise: Secondary | ICD-10-CM | POA: Insufficient documentation

## 2019-09-05 DIAGNOSIS — I10 Essential (primary) hypertension: Secondary | ICD-10-CM

## 2019-09-05 DIAGNOSIS — Z20822 Contact with and (suspected) exposure to covid-19: Secondary | ICD-10-CM

## 2019-09-05 DIAGNOSIS — Z7189 Other specified counseling: Secondary | ICD-10-CM

## 2019-09-05 HISTORY — DX: Essential (primary) hypertension: I10

## 2019-09-05 HISTORY — DX: Other malaise: R53.81

## 2019-09-05 HISTORY — DX: Snoring: R06.83

## 2019-09-05 HISTORY — DX: Other specified counseling: Z71.89

## 2019-09-05 MED ORDER — SPIRONOLACTONE 25 MG PO TABS: 25.0000 mg | ORAL_TABLET | Freq: Every day | ORAL | 2 refills | Status: DC

## 2019-09-05 MED ORDER — HYDRALAZINE HCL 25 MG PO TABS: 12.5000 mg | ORAL_TABLET | Freq: Three times a day (TID) | ORAL | 2 refills | Status: DC

## 2019-09-05 MED FILL — SPIRONOLACTONE 25 MG TABS: 25 | 30 days supply | Qty: 30 | Fill #0

## 2019-09-05 MED FILL — hydrALAZINE HCL 25 MG TABS: 25 | 30 days supply | Qty: 45 | Fill #0

## 2019-09-05 NOTE — Telephone Encounter (Signed)
Staff message sent to [facility] patient's insurance does not require a PA for sleep study. Ok to schedule.

## 2019-09-05 NOTE — Patient Instructions (Addendum)
Medication Instructions:  Your physician has recommended you make the following change in your medication:   CONTINUE clonidine (catapres) 0.1 mg: Take 1 tablet daily at bedtime  START spironolactone (aldactone) 25 mg: Take 1 tablet daily  START hydralazine (apresoline) 25 mg: Take 0.5 tablet (12.5 mg) tablet three times daily   **If your systolic blood pressure is less than 130, decrease labetalol to 400 mg twice daily.   *If you need a refill on your cardiac medications before your next appointment, please call your pharmacy*  Lab Work: Your physician recommends that you return for lab work in 1 week: BMP, lipoprotein A (LPa), COVID antibodies. Please return to our office for lab work, no appointment needed. No need to fast beforehand.   If you have labs (blood work) drawn today and your tests are completely normal, you will receive your results only by: Marland Kitchen MyChart Message (if you have MyChart) OR . A paper copy in the mail If you have any lab test that is abnormal or we need to change your treatment, we will call you to review the results.  Testing/Procedures: You will be scheduled for a CT calcium scoring at the [facility] office on Friday, 09/23/2019 at 2:00 pm. Please go to [place], [place] [address] and arrive 15 minutes early. This test costs $150 out of pocket as insurance does not cover it. Please bring your payment to your appointment.   Your physician has recommended that you have a sleep study. This test records several body functions during sleep, including: brain activity, eye movement, oxygen and carbon dioxide blood levels, heart rate and rhythm, breathing rate and rhythm, the flow of air through your mouth and nose, snoring, body muscle movements, and chest and belly movement. You will be contacted with the scheduled date and time of your sleep study once insurance has approved testing.   Follow-Up: At [facility], you and your health needs are our  priority.  As part of our continuing mission to provide you with exceptional heart care, we have created designated Provider Care Teams.  These Care Teams include your primary Cardiologist (physician) and Advanced Practice Providers (APPs -  Physician Assistants and Nurse Practitioners) who all work together to provide you with the care you need, when you need it.  Your next appointment:   2 week(s)  The format for your next appointment:   In Person  Provider:   You may see Dr. Bettina Gavia or the following Advanced Practice Provider on your designated Care Team:    Laurann Montana, FNP    Coronary Calcium Scan A coronary calcium scan is an imaging test used to look for deposits of calcium and other fatty materials (plaques) in the inner lining of the blood vessels of the heart (coronary arteries). These deposits of calcium and plaques can partly clog and narrow the coronary arteries without producing any symptoms or warning signs. This puts a person at risk for a heart attack. This test can detect these deposits before symptoms develop. Tell a health care provider about:  Any allergies you have.  All medicines you are taking, including vitamins, herbs, eye drops, creams, and over-the-counter medicines.  Any problems you or family members have had with anesthetic medicines.  Any blood disorders you have.  Any surgeries you have had.  Any medical conditions you have.  Whether you are pregnant or may be pregnant. What are the risks? Generally, this is a safe procedure. However, problems may occur, including:  Harm to a pregnant woman and her unborn baby. This test involves the use of radiation. Radiation exposure can be dangerous to a pregnant woman and her unborn baby. If you are pregnant, you generally should not have this procedure done.  Slight increase in the risk of cancer. This is because of the radiation involved in the test. What happens before the procedure? No preparation is  needed for this procedure. What happens during the procedure?   You will undress and remove any jewelry around your neck or chest.  You will put on a hospital gown.  Sticky electrodes will be placed on your chest. The electrodes will be connected to an electrocardiogram (ECG) machine to record a tracing of the electrical activity of your heart.  A CT scanner will take pictures of your heart. During this time, you will be asked to lie still and hold your breath for 2-3 seconds while a picture of your heart is being taken. The procedure may vary among health care providers and hospitals. What happens after the procedure?  You can get dressed.  You can return to your normal activities.  It is up to you to get the results of your test. Ask your health care provider, or the department that is doing the test, when your results will be ready. Summary  A coronary calcium scan is an imaging test used to look for deposits of calcium and other fatty materials (plaques) in the inner lining of the blood vessels of the heart (coronary arteries).  Generally, this is a safe procedure. Tell your health care provider if you are pregnant or may be pregnant.  No preparation is needed for this procedure.  A CT scanner will take pictures of your heart.  You can return to your normal activities after the scan is done. This information is not intended to replace advice given to you by your health care provider. Make sure you discuss any questions you have with your health care provider. Document Released: 03/27/2008 Document Revised: 09/11/2017 Document Reviewed: 08/18/2016 Elsevier Patient Education  [employer].   Spironolactone tablets What is this medicine? SPIRONOLACTONE (speer on oh LAK tone) is a diuretic. It helps you make more urine and to lose excess water from your body. This medicine is used to treat high blood pressure, and edema or swelling from heart, kidney, or liver disease. It  is also used to treat patients who make too much aldosterone or have low potassium. This medicine may be used for other purposes; ask your health care provider or pharmacist if you have questions. COMMON BRAND NAME(S): Aldactone What should I tell my health care provider before I take this medicine? They need to know if you have any of these conditions:  high blood level of potassium  kidney disease or trouble making urine  liver disease  an unusual or allergic reaction to spironolactone, other medicines, foods, dyes, or preservatives  pregnant or trying to get pregnant  breast-feeding How should I use this medicine? Take this medicine by mouth with a drink of water. Follow the directions on your prescription label. You can take it with or without food. If it upsets your stomach, take it with food. Do not take your medicine more often than directed. Remember that you will need to pass more urine after taking this medicine. Do not take your doses at a time of day that will cause you problems. Do not take at bedtime. Talk to your pediatrician regarding the use of this  medicine in children. While this drug may be prescribed for selected conditions, precautions do apply. Overdosage: If you think you have taken too much of this medicine contact a poison control center or emergency room at once. NOTE: This medicine is only for you. Do not share this medicine with others. What if I miss a dose? If you miss a dose, take it as soon as you can. If it is almost time for your next dose, take only that dose. Do not take double or extra doses. What may interact with this medicine? Do not take this medicine with any of the following medications:  cidofovir  eplerenone  tranylcypromine This medicine may also interact with the following medications:  aspirin  certain medicines for blood pressure or heart disease like benazepril, lisinopril, losartan, valsartan  certain medicines that treat or  prevent blood clots like heparin and enoxaparin  cholestyramine  cyclosporine  digoxin  lithium  medicines that relax muscles for surgery  NSAIDs, medicines for pain and inflammation, like ibuprofen or naproxen  other diuretics  potassium supplements  steroid medicines like prednisone or cortisone  trimethoprim This list may not describe all possible interactions. Give your health care provider a list of all the medicines, herbs, non-prescription drugs, or dietary supplements you use. Also tell them if you smoke, drink alcohol, or use illegal drugs. Some items may interact with your medicine. What should I watch for while using this medicine? Visit your doctor or health care professional for regular checks on your progress. Check your blood pressure as directed. Ask your doctor what your blood pressure should be, and when you should contact them. You may need to be on a special diet while taking this medicine. Ask your doctor. Also, ask how many glasses of fluid you need to drink a day. You must not get dehydrated. This medicine may make you feel confused, dizzy or lightheaded. Drinking alcohol and taking some medicines can make this worse. Do not drive, use machinery, or do anything that needs mental alertness until you know how this medicine affects you. Do not sit or stand up quickly. What side effects may I notice from receiving this medicine? Side effects that you should report to your doctor or health care professional as soon as possible:  allergic reactions such as skin rash or itching, hives, swelling of the lips, mouth, tongue, or throat  black or tarry stools  fast, irregular heartbeat  fever  muscle pain, cramps  numbness, tingling in hands or feet  trouble breathing  trouble passing urine  unusual bleeding  unusually weak or tired Side effects that usually do not require medical attention (report to your doctor or health care professional if they continue  or are bothersome):  change in voice or hair growth  confusion  dizzy, drowsy  dry mouth, increased thirst  enlarged or tender breasts  headache  irregular menstrual periods  sexual difficulty, unable to have an erection  stomach upset This list may not describe all possible side effects. Call your doctor for medical advice about side effects. You may report side effects to FDA at 1-800-FDA-1088. Where should I keep my medicine? Keep out of the reach of children. Store below 25 degrees C (77 degrees F). Throw away any unused medicine after the expiration date. NOTE: This sheet is a summary. It may not cover all possible information. If you have questions about this medicine, talk to your doctor, pharmacist, or health care provider.  2020 Elsevier/Gold Standard (2017-02-13 09:42:28)  Hydralazine tablets What is this medicine? HYDRALAZINE (hye DRAL a zeen) is a type of vasodilator. It relaxes blood vessels, increasing the blood and oxygen supply to your heart. This medicine is used to treat high blood pressure. This medicine may be used for other purposes; ask your health care provider or pharmacist if you have questions. COMMON BRAND NAME(S): Apresoline What should I tell my health care provider before I take this medicine? They need to know if you have any of these conditions:  blood vessel disease  heart disease including angina or history of heart attack  kidney or liver disease  systemic lupus erythematosus (SLE)  an unusual or allergic reaction to hydralazine, tartrazine dye, other medicines, foods, dyes, or preservatives  pregnant or trying to get pregnant  breast-feeding How should I use this medicine? Take this medicine by mouth with a glass of water. Follow the directions on the prescription label. Take your doses at regular intervals. Do not take your medicine more often than directed. Do not stop taking except on the advice of your doctor or health care  professional. Talk to your pediatrician regarding the use of this medicine in children. Special care may be needed. While this drug may be prescribed for children for selected conditions, precautions do apply. Overdosage: If you think you have taken too much of this medicine contact a poison control center or emergency room at once. NOTE: This medicine is only for you. Do not share this medicine with others. What if I miss a dose? If you miss a dose, take it as soon as you can. If it is almost time for your next dose, take only that dose. Do not take double or extra doses. What may interact with this medicine?  medicines for high blood pressure  medicines for mental depression This list may not describe all possible interactions. Give your health care provider a list of all the medicines, herbs, non-prescription drugs, or dietary supplements you use. Also tell them if you smoke, drink alcohol, or use illegal drugs. Some items may interact with your medicine. What should I watch for while using this medicine? Visit your doctor or health care professional for regular checks on your progress. Check your blood pressure and pulse rate regularly. Ask your doctor or health care professional what your blood pressure and pulse rate should be and when you should contact him or her. You may get drowsy or dizzy. Do not drive, use machinery, or do anything that needs mental alertness until you know how this medicine affects you. Do not stand or sit up quickly, especially if you are an older patient. This reduces the risk of dizzy or fainting spells. Alcohol may interfere with the effect of this medicine. Avoid alcoholic drinks. Do not treat yourself for coughs, colds, or pain while you are taking this medicine without asking your doctor or health care professional for advice. Some ingredients may increase your blood pressure. What side effects may I notice from receiving this medicine? Side effects that you  should report to your doctor or health care professional as soon as possible:  chest pain, or fast or irregular heartbeat  fever, chills, or sore throat  numbness or tingling in the hands or feet  shortness of breath  skin rash, redness, blisters or itching  stiff or swollen joints  sudden weight gain  swelling of the feet or legs  swollen lymph glands  unusual weakness Side effects that usually do not require medical attention (report to your  doctor or health care professional if they continue or are bothersome):  diarrhea, or constipation  headache  loss of appetite  nausea, vomiting This list may not describe all possible side effects. Call your doctor for medical advice about side effects. You may report side effects to FDA at 1-800-FDA-1088. Where should I keep my medicine? Keep out of the reach of children. Store at room temperature between 15 and 30 degrees C (59 and 86 degrees F). Throw away any unused medicine after the expiration date. NOTE: This sheet is a summary. It may not cover all possible information. If you have questions about this medicine, talk to your doctor, pharmacist, or health care provider.  2020 Elsevier/Gold Standard (2008-02-11 15:44:58)

## 2019-09-12 MED FILL — NORETHINDRONE 5 MG TABLET: 5 | 90 days supply | Qty: 90 | Fill #3

## 2019-09-13 DIAGNOSIS — Z8249 Family history of ischemic heart disease and other diseases of the circulatory system: Secondary | ICD-10-CM | POA: Diagnosis not present

## 2019-09-13 DIAGNOSIS — R0683 Snoring: Secondary | ICD-10-CM | POA: Diagnosis not present

## 2019-09-13 DIAGNOSIS — I10 Essential (primary) hypertension: Secondary | ICD-10-CM | POA: Diagnosis not present

## 2019-09-13 DIAGNOSIS — Z7189 Other specified counseling: Secondary | ICD-10-CM | POA: Diagnosis not present

## 2019-09-13 DIAGNOSIS — Z20828 Contact with and (suspected) exposure to other viral communicable diseases: Secondary | ICD-10-CM | POA: Diagnosis not present

## 2019-09-15 LAB — BASIC METABOLIC PANEL
BUN/Creatinine Ratio: 16 (ref 9–23)
BUN: 16 mg/dL (ref 6–24)
CO2: 19 mmol/L — ABNORMAL LOW (ref 20–29)
Calcium: 9.3 mg/dL (ref 8.7–10.2)
Chloride: 107 mmol/L — ABNORMAL HIGH (ref 96–106)
Creatinine, Ser: 0.98 mg/dL (ref 0.57–1.00)
GFR calc Af Amer: 79 mL/min/{1.73_m2} (ref >59)
GFR calc non Af Amer: 68 mL/min/{1.73_m2} (ref >59)
Glucose: 100 mg/dL — ABNORMAL HIGH (ref 65–99)
Potassium: 4.5 mmol/L (ref 3.5–5.2)
Sodium: 141 mmol/L (ref 134–144)

## 2019-09-15 LAB — LIPOPROTEIN A (LPA): Lipoprotein (a): 18.5 nmol/L (ref <75.0)

## 2019-09-15 LAB — SARS-COV-2 ANTIBODY, IGM: SARS-CoV-2 Antibody, IgM: NEGATIVE

## 2019-09-19 ENCOUNTER — Ambulatory Visit: Payer: 59 | Admitting: Family Medicine

## 2019-09-19 NOTE — Telephone Encounter (Signed)
Called and scheduled patient for an in-lab sleep study on Wednesday, 09/28/2019, and COVID testing at the [facility] on Monday, 09/26/2019, at 11:15 am. Patient made aware of these appointments and states "I might have to call and reschedule." Patient was provided with the phone number for the [facility] to call and reschedule if needed. Patient is agreeable to plan and verbalized understanding. No further questions.

## 2019-09-20 MED FILL — LABETALOL HCL 200 MG TABS: 200 | 30 days supply | Qty: 180 | Fill #4

## 2019-09-21 ENCOUNTER — Ambulatory Visit: Payer: 59 | Admitting: Family

## 2019-09-22 DIAGNOSIS — Z20828 Contact with and (suspected) exposure to other viral communicable diseases: Secondary | ICD-10-CM | POA: Diagnosis not present

## 2019-09-23 ENCOUNTER — Inpatient Hospital Stay: Admission: RE | Admit: 2019-09-23 | Payer: 59 | Source: Ambulatory Visit

## 2019-09-26 ENCOUNTER — Other Ambulatory Visit (HOSPITAL_COMMUNITY): Payer: 59

## 2019-09-28 ENCOUNTER — Encounter (HOSPITAL_BASED_OUTPATIENT_CLINIC_OR_DEPARTMENT_OTHER): Payer: 59 | Admitting: Cardiovascular Disease

## 2019-09-28 MED FILL — CLONIDINE HCL 0.1 MG TABS: 0.1 | 30 days supply | Qty: 60 | Fill #2

## 2019-09-29 MED FILL — hydrALAZINE HCL 25 MG TABS: 25 | 30 days supply | Qty: 45 | Fill #1

## 2019-10-05 ENCOUNTER — Ambulatory Visit: Payer: 59 | Admitting: Cardiology

## 2019-10-05 DIAGNOSIS — M791 Myalgia, unspecified site: Secondary | ICD-10-CM | POA: Diagnosis not present

## 2019-10-05 DIAGNOSIS — R05 Cough: Secondary | ICD-10-CM | POA: Diagnosis not present

## 2019-10-05 DIAGNOSIS — Z20828 Contact with and (suspected) exposure to other viral communicable diseases: Secondary | ICD-10-CM | POA: Diagnosis not present

## 2019-10-15 DIAGNOSIS — Z20822 Contact with and (suspected) exposure to covid-19: Secondary | ICD-10-CM | POA: Diagnosis not present

## 2019-10-15 DIAGNOSIS — Z03818 Encounter for observation for suspected exposure to other biological agents ruled out: Secondary | ICD-10-CM | POA: Diagnosis not present

## 2019-10-21 ENCOUNTER — Inpatient Hospital Stay: Admission: RE | Admit: 2019-10-21 | Payer: 59 | Source: Ambulatory Visit

## 2019-10-21 DIAGNOSIS — R52 Pain, unspecified: Secondary | ICD-10-CM | POA: Diagnosis not present

## 2019-10-21 DIAGNOSIS — J45909 Unspecified asthma, uncomplicated: Secondary | ICD-10-CM | POA: Diagnosis not present

## 2019-10-21 DIAGNOSIS — U071 COVID-19: Secondary | ICD-10-CM | POA: Diagnosis not present

## 2019-10-21 MED FILL — LABETALOL HCL 200 MG TABS: 200 | 30 days supply | Qty: 180 | Fill #5

## 2019-10-22 ENCOUNTER — Telehealth: Payer: 59

## 2019-10-22 ENCOUNTER — Ambulatory Visit
Admission: EM | Admit: 2019-10-22 | Discharge: 2019-10-22 | Disposition: A | Discharge status: Home / Self Care | Payer: 59 | Attending: Physician Assistant | Admitting: Physician Assistant

## 2019-10-22 ENCOUNTER — Encounter: Payer: Self-pay | Admitting: Emergency Medicine

## 2019-10-22 ENCOUNTER — Other Ambulatory Visit: Payer: Self-pay

## 2019-10-22 DIAGNOSIS — I10 Essential (primary) hypertension: Secondary | ICD-10-CM

## 2019-10-22 DIAGNOSIS — U071 COVID-19: Secondary | ICD-10-CM

## 2019-10-22 MED ORDER — PREDNISOLONE 5 MG (21) PO TBPK: 1.0000 | ORAL_TABLET | Freq: Every day | ORAL | 0 refills | Status: DC

## 2019-10-22 MED ORDER — FLOVENT HFA 110 MCG/ACT IN AERO: 2.0000 | INHALATION_SPRAY | Freq: Two times a day (BID) | RESPIRATORY_TRACT | 0 refills | Status: DC

## 2019-10-22 NOTE — ED Notes (Signed)
Patient able to ambulate independently  

## 2019-10-22 NOTE — Discharge Instructions (Signed)
Continue quarantine at this time. You can discontinue quarantine after 10 days of symptom onset AND 24 hours of no fever with improvement of symptoms. Continue albuterol as needed. Flovent 110 called into pharmacy. Prescription of prednisolone in pharmacy if needed. If develop worsening shortness of breath, chest pain, trouble breathing, weakness, dizziness, go to the emergency department for further evaluation needed.

## 2019-10-22 NOTE — ED Triage Notes (Addendum)
Pt presents to [facility] after testing positive for COVID last night.  States she is having body aches, cough, nasal congestion, mild sore throat.  Denies n/v/d.  C/o hx of asthma, and has been having to use her inhaler more than normal.  States the inhaler DOES help her dyspnea, but that due to her new diagnosis, she wanted to be checked out to see if she needs to be taking anything else.  O2 sats at home are being monitored, stays above 98%.  Pt states she finished a Z-pack (last day Thursday), which she had on hand at home.  Also states she started using her Flovent, but wants to discuss changing her prescription.

## 2019-10-22 NOTE — ED Provider Notes (Signed)
[facility]    CSN: [id number] Arrival date & time: 10/22/19  0943      History   Chief Complaint Chief Complaint  Patient presents with  . COVID Positive  . Shortness of Breath    HPI Lindsay Burke is a 49 y.o. female.   49 year old female comes in for 2 day of URI symptoms. Has had cough, body aches, nasal congestion, mild sore throat. Living with COVID positive family members.  Tested positive for Covid yesterday.  Denies fever, chills, body aches. Denies abdominal pain, nausea, vomiting, diarrhea. Loss of taste/smell. States feels short of breath, which is improved by albuterol inhaler. States is on Flovent 177mcg/act, but accidentally used son's 79mcg inhaler.   States recently had 2 weeks of URI symptoms and finished zpack 1 week ago. These symptoms completely resolved after finishing zpack.     Past Medical History:  Diagnosis Date  . Asthma   . Hypertension   . Thyroid disease     Patient Active Problem List   Diagnosis Date Noted  . Essential hypertension 09/05/2019  . Malaise and fatigue 09/05/2019  . Cardiac risk counseling 09/05/2019  . Snores 09/05/2019    Past Surgical History:  Procedure Laterality Date  . CHOLECYSTECTOMY      OB History   No obstetric history on file.      Home Medications    Prior to Admission medications   Medication Sig Start Date End Date Taking? Authorizing Provider  acetaminophen (TYLENOL) 500 MG tablet Take 1 tablet by mouth as needed.    [provider]  albuterol (VENTOLIN HFA) 108 (90 Base) MCG/ACT inhaler Inhale 2 puffs into the lungs every 4 (four) hours as needed. 02/14/16 09/15/19  [provider]  cetirizine (ZYRTEC) 10 MG tablet Take 5 mg by mouth daily.    [provider]  cloNIDine (CATAPRES) 0.1 MG tablet Take 0.1 tablets by mouth daily. 05/10/19   [provider]  fluticasone (FLONASE) 50 MCG/ACT nasal spray SPRAY 1 SPRAY INTO EACH NOSTRIL EVERY DAY 11/06/17    [provider]  fluticasone (FLOVENT HFA) 110 MCG/ACT inhaler Inhale 2 puffs into the lungs 2 (two) times daily. 10/22/19   Tasia Catchings, Avaiah Stempel V, PA-C  hydrALAZINE (APRESOLINE) 25 MG tablet Take 0.5 tablets (12.5 mg total) by mouth 3 (three) times daily. Patient taking differently: Take 12.5 mg by mouth 2 (two) times daily.  09/05/19 12/04/19  Richardo Priest, MD  labetalol (NORMODYNE) 200 MG tablet TAKE 2 TABLETS BY MOUTH THREE TIMES DAILY 10/31/13   [provider]  norethindrone (AYGESTIN) 5 MG tablet Take 5 mg by mouth at bedtime. 05/20/16   [provider]  prednisoLONE 5 MG (21) TBPK Take 1 Dose by mouth daily. Follow pack instruction 10/22/19   Tasia Catchings, Makaylen Thieme V, PA-C  Vitamin D, Ergocalciferol, (DRISDOL) 1.25 MG (50000 UT) CAPS capsule TAKE 1 CAPSULE BY MOUTH ONCE A WEEK 08/06/17   [provider]  spironolactone (ALDACTONE) 25 MG tablet Take 1 tablet (25 mg total) by mouth daily. 09/05/19 10/22/19  Richardo Priest, MD    Family History Family History  Problem Relation Age of Onset  . Hypertension Mother   . Heart attack Father   . Hypertension Father     Social History Social History   Tobacco Use  . Smoking status: Never Smoker  . Smokeless tobacco: Never Used  Substance Use Topics  . Alcohol use: Never  . Drug use: Never     Allergies  Acetaminophen, Clindamycin/lincomycin, Chlorthalidone, and Nifedipine   Review of Systems Review of Systems  Reason unable to perform ROS: See HPI as above.     Physical Exam Triage Vital Signs ED Triage Vitals  Enc Vitals Group     BP 10/22/19 0956 (!) 151/85     Pulse Rate 10/22/19 0956 94     Resp 10/22/19 0956 18     Temp 10/22/19 0956 97.6 F (36.4 C)     Temp Source 10/22/19 0956 Temporal     SpO2 10/22/19 0956 97 %     Weight --      Height --      Head Circumference --      Peak Flow --      Pain Score 10/22/19 0958 7     Pain Loc --      Pain Edu? --      Excl. in [place]? --    No data found.   Updated Vital Signs BP (!) 151/85 (BP Location: Left Arm)   Pulse 94   Temp 97.6 F (36.4 C) (Temporal) Comment: Extra Strength Tylenol at 845  Resp 18   SpO2 97%   Physical Exam Constitutional:      General: She is not in acute distress.    Appearance: Normal appearance. She is not ill-appearing, toxic-appearing or diaphoretic.  HENT:     Head: Normocephalic and atraumatic.     Mouth/Throat:     Mouth: Mucous membranes are moist.     Pharynx: Oropharynx is clear. Uvula midline.  Cardiovascular:     Rate and Rhythm: Normal rate and regular rhythm.     Heart sounds: Normal heart sounds. No murmur. No friction rub. No gallop.   Pulmonary:     Effort: Pulmonary effort is normal. No accessory muscle usage, prolonged expiration, respiratory distress or retractions.     Comments: Speaking in full sentences without difficulty. Normal work of breathing. Lungs clear to auscultation without adventitious lung sounds. Musculoskeletal:     Cervical back: Normal range of motion and neck supple.  Neurological:     General: No focal deficit present.     Mental Status: She is alert and oriented to person, place, and time.      UC Treatments / Results  Labs (all labs ordered are listed, but only abnormal results are displayed) Labs Reviewed - No data to display  EKG   Radiology No results found.  Procedures Procedures (including critical care time)  Medications Ordered in UC Medications - No data to display  Initial Impression / Assessment and Plan / UC Course  I have reviewed the triage vital signs and the nursing notes.  Pertinent labs & imaging results that were available during my care of the patient were reviewed by me and considered in my medical decision making (see chart for details).    No alarming signs on exam.  Patient with stable vitals without tachycardia or tachypnea.  No increased work of breathing.  Lungs clear to auscultation bilaterally without adventitious  lung sounds.  Speaking in full sentences without difficulty.  At this time, will have patient continue inhalers as needed, and continue to monitor symptoms.  Will call in Flovent 110 MCG/ACT.  Patient states she has extensive history of elevated blood pressure due to medications, and would defer other symptomatic management at this time.  Rx of prednisolone called into pharmacy for patient if needed.  Return precautions given.  Patient expresses understanding and agrees to plan.  Final Clinical  Impressions(s) / UC Diagnoses   Final diagnoses:  [id number]   ED Prescriptions    Medication Sig Dispense Auth. Provider   prednisoLONE 5 MG (21) TBPK Take 1 Dose by mouth daily. Follow pack instruction 21 each Kaysie Michelini V, PA-C   fluticasone (FLOVENT HFA) 110 MCG/ACT inhaler Inhale 2 puffs into the lungs 2 (two) times daily. 1 Inhaler Ok Edwards, PA-C     PDMP not reviewed this encounter.   Ok Edwards, PA-C 10/22/19 1042

## 2019-10-24 MED FILL — predniSONE 5 MG TABS: 5 | 6 days supply | Qty: 21 | Fill #0

## 2019-10-24 NOTE — Progress Notes (Signed)
Virtual Visit via Video Note   This visit type was conducted due to national recommendations for restrictions regarding the COVID-19 Pandemic (e.g. social distancing) in an effort to limit this patient's exposure and mitigate transmission in our community.  Due to her co-morbid illnesses, this patient is at least at moderate risk for complications without adequate follow up.  This format is felt to be most appropriate for this patient at this time.  All issues noted in this document were discussed and addressed.  A limited physical exam was performed with this format.  Please refer to the patient's chart for her consent to telehealth for [facility].   Date:  10/25/2019   ID:  Lindsay Burke, DOB 1971-05-16, MRN [id number]  Patient Location: Home Provider Location: Office  PCP:  Deborah Chalk, FNP  Cardiologist: Dr. Bettina Gavia Electrophysiologist:  None   Evaluation Performed:  Follow-Up Visit  Chief Complaint: Evaluate hypertension after change in medications previously resistant  History of Present Illness:    Lindsay Burke is a 49 y.o. female with a hx of asthma thyroid disease hypertension and mitral valve prolapse  last seen 09/05/2019.  That time hypertension was poorly controlled with side effect of malaise and fatigue related to clonidine and nonselective beta-blocker.  He was advised to have a sleep study for obstructive sleep apnea and cardiac CT calcium score.  Late access was normal.  She was seen in the emergency room 10/22/2019 with COVID-19 exposure myalgias congestion sore throat and was noted to be Covid positive.  Recently was treated with a Z-Pak. The patient does have symptoms concerning for COVID-19 infection (fever, chills, cough, or new shortness of breath).   CTA of chest 08/27/2019 showed no evidence of pulmonary embolism normal cardiac contours trace bilateral pleural fluid  Her husband developed Covid after Christmas she had an exposure and she tells me she was  diagnosed about 1 week ago and has mild respiratory symptoms.  Her oxygen sats are greater than 90% and she is using her bronchodilators more frequently.  When she changed her medications and started spironolactone she had a paradoxical response stopped it and actually had hypotension from hydralazine and now skips the morning dose and takes it twice a day.  Blood pressure been consistently less than 120/80 prior to Covid and now runs 120-130/80-90.  He continues on clonidine twice daily labetalol and will work on a do is bring her back to the office in about 4 weeks post Covid and transition her to carvedilol to mitigate the fatigue she has from both clonidine and her beta-blocker.  Not short of breath no chest pain palpitation or syncope.  Past Medical History:  Diagnosis Date  . Asthma   . Hypertension   . Thyroid disease    Past Surgical History:  Procedure Laterality Date  . CHOLECYSTECTOMY       Current Meds  Medication Sig  . acetaminophen (TYLENOL) 500 MG tablet Take 1 tablet by mouth as needed.  Marland Kitchen albuterol (VENTOLIN HFA) 108 (90 Base) MCG/ACT inhaler Inhale 2 puffs into the lungs every 4 (four) hours as needed.  . cetirizine (ZYRTEC) 10 MG tablet Take 5 mg by mouth daily.  . cloNIDine (CATAPRES) 0.1 MG tablet Take 0.1 mg by mouth 2 (two) times daily.  . fluticasone (FLONASE) 50 MCG/ACT nasal spray SPRAY 1 SPRAY INTO EACH NOSTRIL EVERY DAY  . fluticasone (FLOVENT HFA) 110 MCG/ACT inhaler Inhale 2 puffs into the lungs 2 (two) times daily.  . hydrALAZINE (APRESOLINE) 25  MG tablet Take 12.5 mg by mouth 2 (two) times daily.  Marland Kitchen labetalol (NORMODYNE) 200 MG tablet TAKE 2 TABLETS BY MOUTH THREE TIMES DAILY  . norethindrone (AYGESTIN) 5 MG tablet Take 5 mg by mouth at bedtime.  . Vitamin D, Ergocalciferol, (DRISDOL) 1.25 MG (50000 UT) CAPS capsule TAKE 1 CAPSULE BY MOUTH ONCE A WEEK  . [DISCONTINUED] cloNIDine (CATAPRES) 0.1 MG tablet Take 0.1 tablets by mouth daily.  . [DISCONTINUED]  hydrALAZINE (APRESOLINE) 25 MG tablet Take 0.5 tablets (12.5 mg total) by mouth 3 (three) times daily. (Patient taking differently: Take 12.5 mg by mouth 2 (two) times daily. )  . [DISCONTINUED] prednisoLONE 5 MG (21) TBPK Take 1 Dose by mouth daily. Follow pack instruction     Allergies:   Acetaminophen, Clindamycin/lincomycin, Chlorthalidone, Nifedipine, and Spironolactone   Social History   Tobacco Use  . Smoking status: Never Smoker  . Smokeless tobacco: Never Used  Substance Use Topics  . Alcohol use: Never  . Drug use: Never     Family Hx: The patient's family history includes Heart attack in her father; Hypertension in her father and mother.  ROS:   Please see the history of present illness.     All other systems reviewed and are negative.   Prior CV studies:   The following studies were reviewed today:    Labs/Other Tests and Data Reviewed:    EKG:  An ECG dated 08/29/2019 was personally reviewed today and demonstrated:  Sinus rhythm normal  Recent Labs: 08/27/2019: ALT 27; Hemoglobin 13.0; Platelets 218 09/13/2019: BUN 16; Creatinine, Ser 0.98; Potassium 4.5; Sodium 141   Recent Lipid Panel No results found for: CHOL, TRIG, HDL, CHOLHDL, LDLCALC, LDLDIRECT  Wt Readings from Last 3 Encounters:  10/25/19 161 lb (73 kg)  09/05/19 161 lb (73 kg)  08/27/19 151 lb (68.5 kg)     Objective:    Vital Signs:  Ht 5\' 4"  (1.626 m)   Wt 161 lb (73 kg)   BMI 27.64 kg/m    VITAL SIGNS:  reviewed GEN:  no acute distress EYES:  sclerae anicteric, EOMI - Extraocular Movements Intact RESPIRATORY:  normal respiratory effort, symmetric expansion CARDIOVASCULAR:  no peripheral edema SKIN:  no rash, lesions or ulcers. MUSCULOSKELETAL:  no obvious deformities. NEURO:  alert and oriented x 3, no obvious focal deficit PSYCH:  normal affect  ASSESSMENT & PLAN:    1. Hypertension improved continue current regimen once she is post Covid will address her labetalol  transition to carvedilol and adjust hydralazine if needed.  She is pleased with her response is improved I think her recent increase in systolic blood pressure is due to acute medical illness.  Need electrolytes rechecked at that visit. 2. COVID-19 infection she is stable was evaluated in the emergency room has good healthcare literacy and is monitoring symptoms and oxygen saturation at home. 3. Asthma stable she will continue her bronchodilators  COVID-19 Education: The signs and symptoms of COVID-19 were discussed with the patient and how to seek care for testing (follow up with PCP or arrange E-visit).  The importance of social distancing was discussed today.  Time:   Today, I have spent 20 minutes with the patient with telehealth technology discussing the above problems.     Medication Adjustments/Labs and Tests Ordered: Current medicines are reviewed at length with the patient today.  Concerns regarding medicines are outlined above.   Tests Ordered: No orders of the defined types were placed in this encounter.   Medication  Changes: No orders of the defined types were placed in this encounter.   Follow Up:  In Person 1 month  Signed, Shirlee More, MD  10/25/2019 8:51 AM    [facility]

## 2019-10-25 ENCOUNTER — Other Ambulatory Visit: Payer: Self-pay

## 2019-10-25 ENCOUNTER — Telehealth: Payer: 59 | Admitting: Nurse Practitioner

## 2019-10-25 ENCOUNTER — Telehealth (INDEPENDENT_AMBULATORY_CARE_PROVIDER_SITE_OTHER): Payer: 59 | Admitting: Cardiology

## 2019-10-25 ENCOUNTER — Encounter: Payer: Self-pay | Admitting: Cardiology

## 2019-10-25 VITALS — BP 130/90 | HR 71 | Ht 64.0 in | Wt 161.0 lb

## 2019-10-25 DIAGNOSIS — B370 Candidal stomatitis: Secondary | ICD-10-CM

## 2019-10-25 DIAGNOSIS — I10 Essential (primary) hypertension: Secondary | ICD-10-CM | POA: Diagnosis not present

## 2019-10-25 DIAGNOSIS — J4521 Mild intermittent asthma with (acute) exacerbation: Secondary | ICD-10-CM

## 2019-10-25 DIAGNOSIS — U071 COVID-19: Secondary | ICD-10-CM

## 2019-10-25 HISTORY — DX: COVID-19: U07.1

## 2019-10-25 MED ORDER — NYSTATIN 100000 UNIT/ML MT SUSP: 5.0000 mL | Freq: Four times a day (QID) | OROMUCOSAL | 0 refills | Status: DC

## 2019-10-25 NOTE — Progress Notes (Signed)
We are sorry that you are not feeling well.  Here is how we plan to help!   Based on what you have shared with me it is likely that you have thrush. Lindsay Burke is a yeast infection of the mouth. It commonly occurs in adults that use inhalers.  I have prescribed nystatin to swish and swallow 1tsp PO qid for 7 days.  Make sure you rinse your mouth and or brush teeth after using inhalers.  Make sure you  Understand these instructions.  Will watch your condition.  Will get help right away if you are not doing well or get worse.  Your e-visit answers were reviewed by a board certified advanced clinical practitioner to complete your personal care plan.  Depending on the condition, your plan could have included both over the counter or prescription medications.  If there is a problem please reply  once you have received a response from your provider.  Your safety is important to [place].  If you have drug allergies check your prescription carefully.    You can use MyChart to ask questions about today's visit, request a non-urgent call back, or ask for a work or school excuse for 24 hours related to this e-Visit. If it has been greater than 24 hours you will need to follow up with your provider, or enter a new e-Visit to address those concerns.  You will get an e-mail in the next two days asking about your experience.  I hope that your e-visit has been valuable and will speed your recovery. Thank you for using e-visits.  5-10 minutes spent reviewing and documenting in chart.

## 2019-10-26 MED FILL — NYSTATIN 100,000 UNITS/ML S: 100000 | 6 days supply | Qty: 120 | Fill #0

## 2019-10-27 ENCOUNTER — Other Ambulatory Visit: Payer: Self-pay

## 2019-10-27 ENCOUNTER — Emergency Department (HOSPITAL_BASED_OUTPATIENT_CLINIC_OR_DEPARTMENT_OTHER): Payer: 59

## 2019-10-27 ENCOUNTER — Emergency Department (HOSPITAL_BASED_OUTPATIENT_CLINIC_OR_DEPARTMENT_OTHER)
Admission: EM | Admit: 2019-10-27 | Discharge: 2019-10-28 | Disposition: A | Discharge status: Home / Self Care | Payer: 59 | Attending: Emergency Medicine | Admitting: Emergency Medicine

## 2019-10-27 ENCOUNTER — Encounter (HOSPITAL_BASED_OUTPATIENT_CLINIC_OR_DEPARTMENT_OTHER): Payer: Self-pay | Admitting: *Deleted

## 2019-10-27 DIAGNOSIS — U071 COVID-19: Secondary | ICD-10-CM | POA: Insufficient documentation

## 2019-10-27 DIAGNOSIS — R1031 Right lower quadrant pain: Secondary | ICD-10-CM | POA: Diagnosis present

## 2019-10-27 DIAGNOSIS — J1289 Other viral pneumonia: Secondary | ICD-10-CM | POA: Insufficient documentation

## 2019-10-27 DIAGNOSIS — R079 Chest pain, unspecified: Secondary | ICD-10-CM | POA: Diagnosis not present

## 2019-10-27 DIAGNOSIS — J45909 Unspecified asthma, uncomplicated: Secondary | ICD-10-CM | POA: Diagnosis not present

## 2019-10-27 DIAGNOSIS — J1282 Pneumonia due to coronavirus disease 2019: Secondary | ICD-10-CM | POA: Diagnosis not present

## 2019-10-27 DIAGNOSIS — R509 Fever, unspecified: Secondary | ICD-10-CM | POA: Diagnosis not present

## 2019-10-27 DIAGNOSIS — R0602 Shortness of breath: Secondary | ICD-10-CM | POA: Diagnosis not present

## 2019-10-27 DIAGNOSIS — I10 Essential (primary) hypertension: Secondary | ICD-10-CM | POA: Insufficient documentation

## 2019-10-27 DIAGNOSIS — E079 Disorder of thyroid, unspecified: Secondary | ICD-10-CM | POA: Diagnosis not present

## 2019-10-27 LAB — COMPREHENSIVE METABOLIC PANEL
ALT: 121 U/L — ABNORMAL HIGH (ref 0–44)
AST: 104 U/L — ABNORMAL HIGH (ref 15–41)
Albumin: 3.8 g/dL (ref 3.5–5.0)
Alkaline Phosphatase: 61 U/L (ref 38–126)
Anion gap: 9 (ref 5–15)
BUN: 12 mg/dL (ref 6–20)
CO2: 22 mmol/L (ref 22–32)
Calcium: 8.5 mg/dL — ABNORMAL LOW (ref 8.9–10.3)
Chloride: 107 mmol/L (ref 98–111)
Creatinine, Ser: 0.76 mg/dL (ref 0.44–1.00)
GFR calc Af Amer: >60 mL/min (ref >60)
GFR calc non Af Amer: >60 mL/min (ref >60)
Glucose, Bld: 114 mg/dL — ABNORMAL HIGH (ref 70–99)
Potassium: 4 mmol/L (ref 3.5–5.1)
Sodium: 138 mmol/L (ref 135–145)
Total Bilirubin: 0.3 mg/dL (ref 0.3–1.2)
Total Protein: 6.8 g/dL (ref 6.5–8.1)

## 2019-10-27 LAB — CBC WITH DIFFERENTIAL/PLATELET
Abs Immature Granulocytes: 0 10*3/uL (ref 0.00–0.07)
Basophils Absolute: 0 10*3/uL (ref 0.0–0.1)
Basophils Relative: 0 %
Eosinophils Absolute: 0 10*3/uL (ref 0.0–0.5)
Eosinophils Relative: 1 %
HCT: 38.3 % (ref 36.0–46.0)
Hemoglobin: 12.6 g/dL (ref 12.0–15.0)
Immature Granulocytes: 0 %
Lymphocytes Relative: 19 %
Lymphs Abs: 0.7 10*3/uL (ref 0.7–4.0)
MCH: 28.6 pg (ref 26.0–34.0)
MCHC: 32.9 g/dL (ref 30.0–36.0)
MCV: 86.8 fL (ref 80.0–100.0)
Monocytes Absolute: 0.3 10*3/uL (ref 0.1–1.0)
Monocytes Relative: 8 %
Neutro Abs: 2.7 10*3/uL (ref 1.7–7.7)
Neutrophils Relative %: 72 %
Platelets: 139 10*3/uL — ABNORMAL LOW (ref 150–400)
RBC: 4.41 MIL/uL (ref 3.87–5.11)
RDW: 13.6 % (ref 11.5–15.5)
WBC: 3.8 10*3/uL — ABNORMAL LOW (ref 4.0–10.5)
nRBC: 0 % (ref 0.0–0.2)

## 2019-10-27 LAB — LIPASE, BLOOD: Lipase: 30 U/L (ref 11–51)

## 2019-10-27 MED ORDER — KETOROLAC TROMETHAMINE 30 MG/ML IJ SOLN: 30.0000 mg | Freq: Once | INTRAMUSCULAR | Status: DC

## 2019-10-27 MED ORDER — ONDANSETRON HCL 4 MG/2ML IJ SOLN: 4.0000 mg | Freq: Once | INTRAMUSCULAR | Status: DC

## 2019-10-27 NOTE — ED Triage Notes (Addendum)
Pt c/o back pain/ fever  x 2 days , covid pos x 1 week, tylenol only reports she cant take motrin. Tylenol 1gr at 2000, pt did not take Prednisone that was prescribed 1 week ago an has not followed up PMD

## 2019-10-27 NOTE — ED Provider Notes (Signed)
TIME SEEN: 11:00 PM  CHIEF COMPLAINT: Right-sided flank pain, shortness of breath, fever  HPI: Patient is a 49 year old female with history of hypertension, asthma who presents to the emergency department right-sided achy flank pain, shortness of breath, cough, fever.  Tested for COVID-19 on 10/20/19.  Test came back positive the next day.  States she became concerned she could be developing pneumonia.  She denies abdominal pain.  Has had cholecystectomy.  No dysuria.  Is having nausea but no vomiting or diarrhea.  Has lost her sense of taste and smell.  States she was prescribed prednisone by her primary care provider but states she has severe sensitivity to steroids so has not started this medication.  ROS: See HPI Constitutional: fever  Eyes: no drainage  ENT: no runny nose   Cardiovascular:  no chest pain  Resp:  SOB  GI: no vomiting GU: no dysuria Integumentary: no rash  Allergy: no hives  Musculoskeletal: no leg swelling  Neurological: no slurred speech ROS otherwise negative  PAST MEDICAL HISTORY/PAST SURGICAL HISTORY:  Past Medical History:  Diagnosis Date  . Asthma   . Hypertension   . Thyroid disease     MEDICATIONS:  Prior to Admission medications   Medication Sig Start Date End Date Taking? Authorizing Provider  acetaminophen (TYLENOL) 500 MG tablet Take 1 tablet by mouth as needed.   Yes [provider]  albuterol (VENTOLIN HFA) 108 (90 Base) MCG/ACT inhaler Inhale 2 puffs into the lungs every 4 (four) hours as needed. 02/14/16   [provider]  cetirizine (ZYRTEC) 10 MG tablet Take 5 mg by mouth daily.    [provider]  cloNIDine (CATAPRES) 0.1 MG tablet Take 0.1 mg by mouth 2 (two) times daily.    [provider]  fluticasone (FLONASE) 50 MCG/ACT nasal spray SPRAY 1 SPRAY INTO EACH NOSTRIL EVERY DAY 11/06/17   [provider]  fluticasone (FLOVENT HFA) 110 MCG/ACT inhaler Inhale 2 puffs into the lungs 2 (two) times daily.  10/22/19   Tasia Catchings, Amy V, PA-C  hydrALAZINE (APRESOLINE) 25 MG tablet Take 12.5 mg by mouth 2 (two) times daily.    [provider]  labetalol (NORMODYNE) 200 MG tablet TAKE 2 TABLETS BY MOUTH THREE TIMES DAILY 10/31/13   [provider]  norethindrone (AYGESTIN) 5 MG tablet Take 5 mg by mouth at bedtime. 05/20/16   [provider]  nystatin (MYCOSTATIN) 100000 UNIT/ML suspension Take 5 mLs (500,000 Units total) by mouth 4 (four) times daily. 10/25/19   Hassell Done, Mary-Margaret, FNP  Vitamin D, Ergocalciferol, (DRISDOL) 1.25 MG (50000 UT) CAPS capsule TAKE 1 CAPSULE BY MOUTH ONCE A WEEK 08/06/17   [provider]  spironolactone (ALDACTONE) 25 MG tablet Take 1 tablet (25 mg total) by mouth daily. 09/05/19 10/22/19  Richardo Priest, MD    ALLERGIES:  Allergies  Allergen Reactions  . Acetaminophen Hypertension, Other (See Comments) and Rash    Tylenol arthritis puts into CHF   . Clindamycin/Lincomycin Diarrhea  . Chlorthalidone Other (See Comments)    HTN HTN HTN   . Nifedipine Other (See Comments) and Rash    Other reaction(s): Unknown Other reaction(s): Other (See Comments) High BP High BP High BP Other reaction(s): Unknown Other reaction(s): Other (See Comments) High BP High BP High BP Other reaction(s): Unknown Other reaction(s): Other (See Comments) High BP Other reaction(s): Unknown Other reaction(s): Other (See Comments) High BP High BP   . Spironolactone Hypertension    SOCIAL HISTORY:  Social History  Tobacco Use  . Smoking status: Never Smoker  . Smokeless tobacco: Never Used  Substance Use Topics  . Alcohol use: Never    FAMILY HISTORY: Family History  Problem Relation Age of Onset  . Hypertension Mother   . Heart attack Father   . Hypertension Father     EXAM: BP 130/79   Pulse 84   Temp 99.1 F (37.3 C)   Resp 18   Ht 5\' 4"  (1.626 m)   Wt 73 kg   SpO2 99%   BMI 27.62 kg/m  CONSTITUTIONAL: Alert and oriented and  responds appropriately to questions. Well-appearing; well-nourished HEAD: Normocephalic EYES: Conjunctivae clear, pupils appear equal, EOM appear intact ENT: normal nose; moist mucous membranes NECK: Supple, normal ROM CARD: RRR; S1 and S2 appreciated; no murmurs, no clicks, no rubs, no gallops RESP: Normal chest excursion without splinting or tachypnea; breath sounds clear and equal bilaterally; no wheezes, no rhonchi, no rales, no hypoxia or respiratory distress, speaking full sentences ABD/GI: Normal bowel sounds; non-distended; soft, non-tender, no rebound, no guarding, no peritoneal signs, no hepatosplenomegaly BACK:  The back appears normal, no midline spinal tenderness or step-off or deformity, no CVA tenderness, no redness or warmth, no ecchymosis or swelling EXT: Normal ROM in all joints; no deformity noted, no edema; no cyanosis SKIN: Normal color for age and race; warm; no rash on exposed skin NEURO: Moves all extremities equally PSYCH: The patient's mood and manner are appropriate.   MEDICAL DECISION MAKING: Patient here with right sided flank pain.  She is concerned she could be developing pneumonia and I agree.  Will obtain portable chest x-ray.  Differential also includes PE however she is not tachycardic, tachypneic or hypoxic today.  She does not appear in significant distress.  Doubt ACS or dissection.  She has had previous cholecystectomy and her abdominal exam is benign but will check LFTs given location of pain.  We will also check urinalysis to rule out pyelonephritis.  Will provide with Toradol, Zofran for symptomatic relief.  ED PROGRESS: 12:00 AM  Patient's labs show leukopenia, thrombocytopenia and slightly elevated liver function tests consistent with COVID-19.  Lipase is normal.  Chest x-ray clear.  Will obtain a CTA of the chest to rule out PE.  We will also scan through the upper abdomen to evaluate liver and for any biliary dilatation given some of her pain is in the  right upper quadrant although abdominal exam is benign.  Urine pending.  The patient has been updated.  We did discuss that she should likely avoid alcohol and Tylenol given these mildly elevated liver function test.  She verbalized understanding.  12:15 AM  Pt's urine shows no sign of infection or blood.  Pregnancy test negative.  No ketones in the urine to suggest dehydration.  She does report decreased p.o. intake today.  Lipase normal.   No hypoxia with ambulation but patient did feel off balance.  Will check orthostatic vital signs.   1:00 AM  Pt's CT shows no PE.  She does have bilateral Covid pneumonia.  Discussed with patient that this is a viral illness and does not need antibiotics.  She has not had any hypoxia, increased work of breathing or respiratory distress and no hypoxia with ambulation.  We discussed risk and benefits of one-time dose of Decadron here.  States with previous steroids she has had hypertension, tachycardia and severe GI upset.  I feel the side effects may outweigh any benefit for her at this time and  she agrees on holding off currently.  We again discussed the slight elevation in her liver function test.  She states she does not drink alcohol and will avoid Tylenol.  She is able to take ibuprofen but states that it will elevate her blood pressure.  I have recommended ibuprofen at this time for pain and fever control over acetaminophen.  Also recommended close follow-up with her primary care provider.  She has a pulse oximeter at home and will measure her pulse ox.  We discussed if it drops to 89% or less and is persistent that she will need to return to the emergency department.  1:20 AM  Pt's orthostatic vital signs are positive but she would like to increase oral intake at home rather than receiving IV fluids here given she has Covid pneumonia.  I feel this is a very reasonable plan.  She is not vomiting or having diarrhea.  I feel patient is safe for discharge.    At  this time, I do not feel there is any life-threatening condition present. I have reviewed, interpreted and discussed all results (EKG, imaging, lab, urine as appropriate) and exam findings with patient/family. I have reviewed nursing notes and appropriate previous records.  I feel the patient is safe to be discharged home without further emergent workup and can continue workup as an outpatient as needed. Discussed usual and customary return precautions. Patient/family verbalize understanding and are comfortable with this plan.  Outpatient follow-up has been provided as needed. All questions have been answered.   Lindsay Burke was evaluated in Emergency Department on 10/27/2019 for the symptoms described in the history of present illness. She was evaluated in the context of the global COVID-19 pandemic, which necessitated consideration that the patient might be at risk for infection with the SARS-CoV-2 virus that causes COVID-19. Institutional protocols and algorithms that pertain to the evaluation of patients at risk for COVID-19 are in a state of rapid change based on information released by regulatory bodies including the CDC and federal and state organizations. These policies and algorithms were followed during the patient's care in the ED.  Patient was seen wearing N95, face shield, gloves, gown.    , Delice Bison, DO 10/28/19 0120

## 2019-10-28 ENCOUNTER — Emergency Department (HOSPITAL_BASED_OUTPATIENT_CLINIC_OR_DEPARTMENT_OTHER): Payer: 59

## 2019-10-28 ENCOUNTER — Telehealth: Payer: Self-pay | Admitting: Cardiology

## 2019-10-28 ENCOUNTER — Telehealth: Payer: 59

## 2019-10-28 DIAGNOSIS — J45909 Unspecified asthma, uncomplicated: Secondary | ICD-10-CM | POA: Diagnosis not present

## 2019-10-28 DIAGNOSIS — I10 Essential (primary) hypertension: Secondary | ICD-10-CM | POA: Diagnosis not present

## 2019-10-28 DIAGNOSIS — U071 COVID-19: Secondary | ICD-10-CM | POA: Diagnosis not present

## 2019-10-28 DIAGNOSIS — R0602 Shortness of breath: Secondary | ICD-10-CM | POA: Diagnosis not present

## 2019-10-28 DIAGNOSIS — J1289 Other viral pneumonia: Secondary | ICD-10-CM | POA: Diagnosis not present

## 2019-10-28 DIAGNOSIS — R079 Chest pain, unspecified: Secondary | ICD-10-CM | POA: Diagnosis not present

## 2019-10-28 DIAGNOSIS — E079 Disorder of thyroid, unspecified: Secondary | ICD-10-CM | POA: Diagnosis not present

## 2019-10-28 LAB — URINALYSIS, ROUTINE W REFLEX MICROSCOPIC
Bilirubin Urine: NEGATIVE
Glucose, UA: NEGATIVE mg/dL
Hgb urine dipstick: NEGATIVE
Ketones, ur: NEGATIVE mg/dL
Leukocytes,Ua: NEGATIVE
Nitrite: NEGATIVE
Protein, ur: NEGATIVE mg/dL
Specific Gravity, Urine: 1.025 (ref 1.005–1.030)
pH: 6.5 (ref 5.0–8.0)

## 2019-10-28 LAB — PREGNANCY, URINE: Preg Test, Ur: NEGATIVE

## 2019-10-28 MED ORDER — IOHEXOL 350 MG/ML SOLN
100.0000 mL | Freq: Once | INTRAVENOUS | Status: AC
  Administered 2019-10-28: 100 mL via INTRAVENOUS

## 2019-10-28 MED ORDER — SODIUM CHLORIDE 0.9 % IV BOLUS (SEPSIS): 1000.0000 mL | Freq: Once | INTRAVENOUS | Status: DC

## 2019-10-28 NOTE — Telephone Encounter (Signed)
I am a cardiologist this is outside the scope of my practice and I think it should either be the emergency room or the primary care physician who makes the referral

## 2019-10-28 NOTE — ED Notes (Signed)
Pt ambulated around room- pt endorses feeling "off balance". SpO2 remained at 99% and HR 96.

## 2019-10-28 NOTE — Telephone Encounter (Signed)
I left the patient a message with Dr Joya Gaskins recommendation. 1/15

## 2019-10-28 NOTE — Discharge Instructions (Addendum)
There are outpatient clinics for IV Decadron, remdesivir and Covid antibodies.  Please follow-up with your primary care provider closely as they may be able to refer you to these clinics if you meet criteria.   Please quarantine for 10 days after the onset of symptoms.  You will need to be fever free without using Tylenol or Ibuprofen for 3 full days AND your symptoms will need to be significantly improving or resolved (other than the loss of taste and smell which can last up to 3 months) before you can come out of quarantine.  You should isolate from others that you live with as well.  Any close contacts that have seen you in the past 14 days before your symptoms have started will need to be notified and they will need to quarantine for 14 full days even if they have a negative COVID test.  COVID-19 is a viral illness and currently there are no specific outpatient treatments other than supportive measures such as alternating Tylenol and Ibuprofen, rest, increased fluid intake.  You do not need antibiotics.  If you develop chest pain, difficulty breathing, have blue lips or fingertips, begin vomiting and can not stop and can not hold down fluids, feel like you may pass out or you do pass out, have confusion, please return to the emergency department.   Your liver function tests were mildly elevated today.  I recommend that you avoid alcohol and in Tylenol at this time.  You may take ibuprofen 800 mg every 6 hours as needed for fever and pain.

## 2019-10-28 NOTE — Telephone Encounter (Signed)
Patient states that Dr. Bettina Gavia is aware that she has covid per virtual visit with him on on Tuesday 10/25/19. She had to go to the ED yesterday and found out she has Covid and Pneumonia in both lungs. She would like Dr. Bettina Gavia to send a referral to [facility] to help treat this. The phone number to the clinic is [phone] if Dr. Bettina Gavia has any questions for clinic. Please advise.

## 2019-10-31 MED FILL — CLONIDINE HCL 0.1 MG TABS: 0.1 | 30 days supply | Qty: 60 | Fill #3

## 2019-11-03 ENCOUNTER — Telehealth: Payer: 59

## 2019-11-03 ENCOUNTER — Inpatient Hospital Stay: Admission: RE | Admit: 2019-11-03 | Payer: 59 | Source: Ambulatory Visit

## 2019-11-03 MED FILL — FLOVENT HFA 110 MCG INHALER: 110 | 30 days supply | Qty: 12 | Fill #0

## 2019-11-10 ENCOUNTER — Telehealth: Payer: Self-pay | Admitting: Cardiology

## 2019-11-10 NOTE — Telephone Encounter (Signed)
   Discontinue hydralazine discontinue clonidine  The need to be seen in the med center [facility] office by the physician is there next week or tomorrow for the issue of hypertension

## 2019-11-10 NOTE — Telephone Encounter (Signed)
Pt states her BP was consistently 120's/80s until she was diagnosed with a severe case of COVID on Jan 8th and Pneumonia Jan 14th.   Pt was prescribed Prednisone (states she is very sensitive to steroids) which caused her BP to be out of whack.   She states she has had to hold multiple doses of Labetalol and Clonidine due to her being hypotensive. This is causing her tachycardia (resting HR is averaging around 100 bpm when it is usually in the 60s) as well as some chest tightness. Her average BP is now ranging 80s/60s.   Pt is not symptomatic until she stands up and feels dizzy/lightheaded but quickly adjusts.

## 2019-11-10 NOTE — Telephone Encounter (Signed)
Pt c/o BP issue: STAT if pt c/o blurred vision, one-sided weakness or slurred speech  1. What are your last 5 BP readings? 102/70 sitting and averaging in 80's /60's while standing   2. Are you having any other symptoms (ex. Dizziness, headache, blurred vision, passed out)? light headed when standing, and if she misses a dose of medication she gets chest tightness.   3. What is your BP issue? Patient states since having covid her BP has been running really low. She says when she takes her regular meds her BP is very very low when sitting and drops lower when standing. When she modifies her meds its either very high or still too low. She also says her HR while walking up stairs has been pounding and is concerned about this as well. She would like to speak with Dr. Bettina Gavia for advise.

## 2019-11-10 NOTE — Telephone Encounter (Signed)
Spoke with the patient regarding Dr. Joya Gaskins recommendation to seek care from her PCP.   Pt states she has reached out to her PCP before regarding her issues during COVID/PNA and her PCP consistently refers her to other interdisciplinary physicians.   She is overwhelmed with these issues and would like to know if Dr. Bettina Gavia needs to see her in person to advise on her Beta Blocker. Pt states she is concerned about her low BP and tachycardia and does not know how to proceed.

## 2019-11-10 NOTE — Telephone Encounter (Signed)
I think these are post Covid symptoms and I would direct her to her primary care physician

## 2019-11-11 NOTE — Telephone Encounter (Signed)
Spoke with the patient and made her aware of [facility] recommendations. She verbalized understanding and will make an appt next week to be evaluated more closely.

## 2019-11-17 MED FILL — hydrALAZINE HCL 25 MG TABS: 25 | 30 days supply | Qty: 45 | Fill #2

## 2019-11-22 DIAGNOSIS — J4520 Mild intermittent asthma, uncomplicated: Secondary | ICD-10-CM | POA: Diagnosis not present

## 2019-11-22 DIAGNOSIS — Z8619 Personal history of other infectious and parasitic diseases: Secondary | ICD-10-CM | POA: Diagnosis not present

## 2019-11-22 DIAGNOSIS — I10 Essential (primary) hypertension: Secondary | ICD-10-CM | POA: Diagnosis not present

## 2019-11-22 DIAGNOSIS — E669 Obesity, unspecified: Secondary | ICD-10-CM | POA: Diagnosis not present

## 2019-11-22 DIAGNOSIS — D696 Thrombocytopenia, unspecified: Secondary | ICD-10-CM | POA: Diagnosis not present

## 2019-11-22 DIAGNOSIS — E042 Nontoxic multinodular goiter: Secondary | ICD-10-CM | POA: Diagnosis not present

## 2019-11-24 ENCOUNTER — Encounter: Payer: Self-pay | Admitting: *Deleted

## 2019-11-24 ENCOUNTER — Other Ambulatory Visit: Payer: Self-pay | Admitting: Cardiology

## 2019-11-24 DIAGNOSIS — Z8619 Personal history of other infectious and parasitic diseases: Secondary | ICD-10-CM | POA: Diagnosis not present

## 2019-11-24 DIAGNOSIS — D696 Thrombocytopenia, unspecified: Secondary | ICD-10-CM | POA: Diagnosis not present

## 2019-11-24 DIAGNOSIS — I10 Essential (primary) hypertension: Secondary | ICD-10-CM | POA: Diagnosis not present

## 2019-11-24 DIAGNOSIS — E042 Nontoxic multinodular goiter: Secondary | ICD-10-CM | POA: Diagnosis not present

## 2019-11-24 DIAGNOSIS — J1282 Pneumonia due to coronavirus disease 2019: Secondary | ICD-10-CM | POA: Diagnosis not present

## 2019-11-24 MED ORDER — LABETALOL HCL 200 MG PO TABS: 400.0000 mg | ORAL_TABLET | Freq: Three times a day (TID) | ORAL | 3 refills | Status: DC

## 2019-11-24 MED FILL — LABETALOL HCL 200 MG TABS: 200 | 90 days supply | Qty: 540 | Fill #0

## 2019-11-24 NOTE — Telephone Encounter (Signed)
*  STAT* If patient is at the pharmacy, call can be transferred to refill team.   1. Which medications need to be refilled? (please list name of each medication and dose if known) Labetalol  2. Which pharmacy/location (including street and city if local pharmacy) is medication to be sent to? [place]  3. Do they need a 30 day or 90 day supply? 90 days and refills- need this today please

## 2019-11-24 NOTE — Telephone Encounter (Signed)
rx sent to pharmacy

## 2019-11-25 ENCOUNTER — Ambulatory Visit
Admission: RE | Admit: 2019-11-25 | Discharge: 2019-11-25 | Disposition: A | Discharge status: Home / Self Care | Payer: Self-pay | Source: Ambulatory Visit | Attending: Cardiology | Admitting: Cardiology

## 2019-11-25 ENCOUNTER — Other Ambulatory Visit: Payer: Self-pay

## 2019-11-25 ENCOUNTER — Encounter: Payer: Self-pay | Admitting: Family Medicine

## 2019-11-25 DIAGNOSIS — R918 Other nonspecific abnormal finding of lung field: Secondary | ICD-10-CM | POA: Diagnosis not present

## 2019-11-25 DIAGNOSIS — I10 Essential (primary) hypertension: Secondary | ICD-10-CM

## 2019-11-25 DIAGNOSIS — Z8249 Family history of ischemic heart disease and other diseases of the circulatory system: Secondary | ICD-10-CM

## 2019-11-28 NOTE — Telephone Encounter (Signed)
Spoke with patient , she stated her cardiologist gave her the okay to getting the vaccine , and that she got sick a few hours later feeling like she had covid again , but is now better.

## 2019-12-14 MED FILL — CLONIDINE HCL 0.1 MG TABS: 0.1 | 30 days supply | Qty: 60 | Fill #4

## 2019-12-16 DIAGNOSIS — E063 Autoimmune thyroiditis: Secondary | ICD-10-CM | POA: Diagnosis not present

## 2019-12-16 DIAGNOSIS — F329 Major depressive disorder, single episode, unspecified: Secondary | ICD-10-CM | POA: Diagnosis not present

## 2019-12-16 DIAGNOSIS — I10 Essential (primary) hypertension: Secondary | ICD-10-CM | POA: Diagnosis not present

## 2019-12-20 NOTE — Progress Notes (Signed)
Cardiology Office Note:    Date:  12/21/2019   ID:  Lindsay Burke, DOB 1970-12-09, MRN [id number]  PCP:  Forrest Moron, MD  Cardiologist:  Shirlee More, MD    Referring MD: Forrest Moron, MD    ASSESSMENT:    1. Essential hypertension   2. Mitral valve prolapse   3. Dyspnea due to COVID-19    PLAN:    In order of problems listed above:  1. Improved as discussed transition from labetalol to carvedilol to decrease CNS side effects and her BP is at target reduce her to once daily. 2. He is concerned about cardiovascular complications of [id number] with shortness of breath and edema check echocardiogram I think her symptoms are due to pulmonary scarring.  I advised her she may want to seek pulmonary consultation.   Next appointment: Months   Medication Adjustments/Labs and Tests Ordered: Current medicines are reviewed at length with the patient today.  Concerns regarding medicines are outlined above.  Orders Placed This Encounter  Procedures  . ECHOCARDIOGRAM COMPLETE   Meds ordered this encounter  Medications  . carvedilol (COREG) 25 MG tablet    Sig: Take 1 tablet (25 mg total) by mouth 2 (two) times daily with a meal.    Dispense:  180 tablet    Refill:  3    No chief complaint on file.   History of Present Illness:    Lindsay Burke is a 49 y.o. female with a hx of hypertension mitral valve prolapse and asthma last seen 10/25/2019 after COVID-19.Marland Kitchen  She was advised a sleep study which is yet to be performed. CT calcium score was 0.  Most recent labs 10/27/2019 show normal potassium 4.0 and GFR greater than 40 cc..  She is initially seen 09/05/2019 and as part of cardiovascular risk assessment with family history premature CAD of cardiac CT score was ordered.  LP(a) was low normal 18.5 Tatian on my initial note was that her lipid profile was favorable. Compliance with diet, lifestyle and medications: Yes  She was intolerant of MRA and notices at the end of the day she  has dependent edema.  Her blood pressure is now much better controlled she is discussed with her PCP and wants to transition from labetalol to carvedilol and she will do it on Monday.  Her goal is to get off of clonidine which she thinks is affecting her sleep and I told her to wait 2 weeks and if her systolics remain less than [id number] diastolic less than 90 she could withdrawal 1 dose of clonidine at that time.  I warned her about clonidine rebound syndrome.  She is not having headache orthopnea chest pain palpitation or syncope but has exertional shortness of breath she attributes to COVID-19 and has persistent pulmonary infiltrate on chest CT scan. Past Medical History:  Diagnosis Date  . Asthma   . Hypertension   . Thyroid disease     Past Surgical History:  Procedure Laterality Date  . CHOLECYSTECTOMY      Current Medications: Current Meds  Medication Sig  . acetaminophen (TYLENOL) 500 MG tablet Take 1 tablet by mouth as needed.  Marland Kitchen albuterol (VENTOLIN HFA) 108 (90 Base) MCG/ACT inhaler Inhale 2 puffs into the lungs every 4 (four) hours as needed.  . cetirizine (ZYRTEC) 10 MG tablet Take 5 mg by mouth daily.  . Cholecalciferol (VITAMIN D3) 1.25 MG (50000 UT) TABS Take 1 tablet by mouth once a week.  . cloNIDine (CATAPRES) 0.1 MG tablet  Take 0.1 mg by mouth 2 (two) times daily.  . fluticasone (FLONASE) 50 MCG/ACT nasal spray SPRAY 1 SPRAY INTO EACH NOSTRIL EVERY DAY  . fluticasone (FLOVENT HFA) 110 MCG/ACT inhaler Inhale 2 puffs into the lungs 2 (two) times daily.  . hydrALAZINE (APRESOLINE) 25 MG tablet Take 12.5 mg by mouth 2 (two) times daily.  . [DISCONTINUED] labetalol (NORMODYNE) 200 MG tablet Take 2 tablets (400 mg total) by mouth 3 (three) times daily.     Allergies:   Acetaminophen, Clindamycin/lincomycin, Chlorthalidone, Nifedipine, and Spironolactone   Social History   Socioeconomic History  . Marital status: Married    Spouse name: Not on file  . Number of children: Not  on file  . Years of education: Not on file  . Highest education level: Not on file  Occupational History  . Not on file  Tobacco Use  . Smoking status: Never Smoker  . Smokeless tobacco: Never Used  Substance and Sexual Activity  . Alcohol use: Never  . Drug use: Never  . Sexual activity: Not on file    Comment: progestrone  Other Topics Concern  . Not on file  Social History Narrative  . Not on file   Social Determinants of Health   Financial Resource Strain:   . Difficulty of Paying Living Expenses: Not on file  Food Insecurity:   . Worried About Charity fundraiser in the Last Year: Not on file  . Ran Out of Food in the Last Year: Not on file  Transportation Needs:   . Lack of Transportation (Medical): Not on file  . Lack of Transportation (Non-Medical): Not on file  Physical Activity:   . Days of Exercise per Week: Not on file  . Minutes of Exercise per Session: Not on file  Stress:   . Feeling of Stress : Not on file  Social Connections:   . Frequency of Communication with Friends and Family: Not on file  . Frequency of Social Gatherings with Friends and Family: Not on file  . Attends Religious Services: Not on file  . Active Member of Clubs or Organizations: Not on file  . Attends Archivist Meetings: Not on file  . Marital Status: Not on file     Family History: The patient's family history includes Heart attack in her father; Hypertension in her father and mother. ROS:   Please see the history of present illness.    All other systems reviewed and are negative.  EKGs/Labs/Other Studies Reviewed:    The following studies were reviewed today:   Recent Labs: 10/27/2019: ALT 121; BUN 12; Creatinine, Ser 0.76; Hemoglobin 12.6; Platelets 139; Potassium 4.0; Sodium 138  Recent Lipid Panel No results found for: CHOL, TRIG, HDL, CHOLHDL, VLDL, LDLCALC, LDLDIRECT  Physical Exam:    VS:  BP 118/78   Pulse 79   Temp 98 F (36.7 C) (Oral)   Wt 167 lb  (75.8 kg)   SpO2 98%   BMI 28.67 kg/m     Wt Readings from Last 3 Encounters:  12/21/19 167 lb (75.8 kg)  10/27/19 160 lb 15 oz (73 kg)  10/25/19 161 lb (73 kg)     GEN:  Well nourished, well developed in no acute distress HEENT: Normal NECK: No JVD; No carotid bruits LYMPHATICS: No lymphadenopathy CARDIAC: RRR, no murmurs, rubs, gallops RESPIRATORY:  Clear to auscultation without rales, wheezing or rhonchi  ABDOMEN: Soft, non-tender, non-distended MUSCULOSKELETAL: 1+ bilateral lower extremity pitting edema; No deformity  SKIN: Warm  and dry NEUROLOGIC:  Alert and oriented x 3 PSYCHIATRIC:  Normal affect    Signed, Shirlee More, MD  12/21/2019 11:48 AM    [place]

## 2019-12-21 ENCOUNTER — Other Ambulatory Visit: Payer: Self-pay

## 2019-12-21 ENCOUNTER — Encounter: Payer: Self-pay | Admitting: Cardiology

## 2019-12-21 ENCOUNTER — Ambulatory Visit (INDEPENDENT_AMBULATORY_CARE_PROVIDER_SITE_OTHER): Payer: 59 | Admitting: Cardiology

## 2019-12-21 VITALS — BP 118/78 | HR 79 | Temp 98.0°F | Wt 167.0 lb

## 2019-12-21 DIAGNOSIS — I341 Nonrheumatic mitral (valve) prolapse: Secondary | ICD-10-CM | POA: Diagnosis not present

## 2019-12-21 DIAGNOSIS — I10 Essential (primary) hypertension: Secondary | ICD-10-CM

## 2019-12-21 DIAGNOSIS — R0600 Dyspnea, unspecified: Secondary | ICD-10-CM

## 2019-12-21 DIAGNOSIS — U071 COVID-19: Secondary | ICD-10-CM | POA: Insufficient documentation

## 2019-12-21 HISTORY — DX: COVID-19: U07.1

## 2019-12-21 HISTORY — DX: Dyspnea, unspecified: R06.00

## 2019-12-21 MED ORDER — CARVEDILOL 25 MG PO TABS: 25.0000 mg | ORAL_TABLET | Freq: Two times a day (BID) | ORAL | 3 refills | Status: DC

## 2019-12-21 MED FILL — CARVEDILOL 25 MG TABLET: 25 | 90 days supply | Qty: 180 | Fill #0

## 2019-12-21 NOTE — Patient Instructions (Signed)
Medication Instructions:  Your physician has recommended you make the following change in your medication 1-STOP Labetalol 2-START Carvedilol 25 mg by mouth twice daily with food. 3-Wait 2 weeks, then reduce Clonidine to once daily if BP less than 140/90  *If you need a refill on your cardiac medications before your next appointment, please call your pharmacy*  Lab Work: If you have labs (blood work) drawn today and your tests are completely normal, you will receive your results only by: Marland Kitchen MyChart Message (if you have MyChart) OR . A paper copy in the mail If you have any lab test that is abnormal or we need to change your treatment, we will call you to review the results.  Testing/Procedures: Your physician has requested that you have an echocardiogram. Echocardiography is a painless test that uses sound waves to create images of your heart. It provides your doctor with information about the size and shape of your heart and how well your heart's chambers and valves are working. This procedure takes approximately one hour. There are no restrictions for this procedure.  Follow-Up: At [facility], you and your health needs are our priority.  As part of our continuing mission to provide you with exceptional heart care, we have created designated Provider Care Teams.  These Care Teams include your primary Cardiologist (physician) and Advanced Practice Providers (APPs -  Physician Assistants and Nurse Practitioners) who all work together to provide you with the care you need, when you need it.  We recommend signing up for the patient portal called "MyChart".  Sign up information is provided on this After Visit Summary.  MyChart is used to connect with patients for Virtual Visits (Telemedicine).  Patients are able to view lab/test results, encounter notes, upcoming appointments, etc.  Non-urgent messages can be sent to your provider as well.   To learn more about what you can do with MyChart, go to  [email or web address].    Your next appointment:   3 month(s)  The format for your next appointment:   In Person  Provider:   You may see Dr. Bettina Gavia or the following Advanced Practice Provider on your designated Care Team:    Laurann Montana, FNP

## 2019-12-26 ENCOUNTER — Other Ambulatory Visit (HOSPITAL_BASED_OUTPATIENT_CLINIC_OR_DEPARTMENT_OTHER): Payer: 59

## 2019-12-27 ENCOUNTER — Other Ambulatory Visit: Payer: Self-pay

## 2019-12-27 ENCOUNTER — Ambulatory Visit (HOSPITAL_BASED_OUTPATIENT_CLINIC_OR_DEPARTMENT_OTHER)
Admission: RE | Admit: 2019-12-27 | Discharge: 2019-12-27 | Disposition: A | Discharge status: Home / Self Care | Payer: 59 | Source: Ambulatory Visit | Attending: Cardiology | Admitting: Cardiology

## 2019-12-27 DIAGNOSIS — R0600 Dyspnea, unspecified: Secondary | ICD-10-CM | POA: Diagnosis not present

## 2019-12-27 DIAGNOSIS — I10 Essential (primary) hypertension: Secondary | ICD-10-CM

## 2019-12-27 DIAGNOSIS — I341 Nonrheumatic mitral (valve) prolapse: Secondary | ICD-10-CM | POA: Insufficient documentation

## 2019-12-27 DIAGNOSIS — U071 COVID-19: Secondary | ICD-10-CM

## 2019-12-27 MED FILL — NORETHINDRONE 5 MG TABLET: 5 | 90 days supply | Qty: 90 | Fill #0

## 2019-12-27 NOTE — Progress Notes (Signed)
  Echocardiogram 2D Echocardiogram has been performed.  Lindsay Burke 12/27/2019, 10:18 AM

## 2019-12-27 NOTE — Progress Notes (Signed)
Yes please place order for BMP pro BNP at [facility] office   Message text    Orders placed per Dr. Bettina Gavia

## 2019-12-28 ENCOUNTER — Telehealth: Payer: Self-pay

## 2019-12-28 LAB — BASIC METABOLIC PANEL
BUN/Creatinine Ratio: 9 (ref 9–23)
BUN: 8 mg/dL (ref 6–24)
CO2: 18 mmol/L — ABNORMAL LOW (ref 20–29)
Calcium: 9.2 mg/dL (ref 8.7–10.2)
Chloride: 107 mmol/L — ABNORMAL HIGH (ref 96–106)
Creatinine, Ser: 0.93 mg/dL (ref 0.57–1.00)
GFR calc Af Amer: 83 mL/min/{1.73_m2} (ref >59)
GFR calc non Af Amer: 72 mL/min/{1.73_m2} (ref >59)
Glucose: 91 mg/dL (ref 65–99)
Potassium: 4.2 mmol/L (ref 3.5–5.2)
Sodium: 140 mmol/L (ref 134–144)

## 2019-12-28 LAB — PRO B NATRIURETIC PEPTIDE: NT-Pro BNP: 178 pg/mL (ref 0–249)

## 2019-12-28 NOTE — Telephone Encounter (Signed)
The patient has been notified of the result and verbalized understanding.  All questions (if any) were answered. Wilma Flavin, RN 12/28/2019 10:54 AM

## 2019-12-28 NOTE — Telephone Encounter (Signed)
This is very complicated I think she would really benefit from a multimodality program of hypertension clinic we be able to give her very frequent interactions to manipulate medications in real-time.  I feel very strongly that she does not have congestive heart failure.  If she would like I could place her on a diuretic but my memory in the past states she was intolerant.  See if we can get a quick appointment for her in the hypertension clinic.

## 2019-12-28 NOTE — Telephone Encounter (Signed)
Spoke with the patient at length regarding her recent BP. She recently switched form Labetalol 1200 mg per day to Carvedilol 25 mg BID. This change was not made until yesterday so she does not think that any of the following symptoms are due to switching BP meds.   She states her BP usually runs high in the afternoons.   She reports having bilateral lower extremity edema "like shes 9 months pregnant"-she states her ankles have never been this swollen. She is on a strict low sodium diet.   Pt will send a photo of her ankles via mychart    She states her BP before bed was 168/100- approximately 1.5 hours after taking her Carvedilol.   Pt takes first dose Clonidine at 10 am, second dose at 8pm- pt been taking about 6 mo- prescribed by old cardiologist  Pt BP today at 7:30am 120/82. Current manual BP 152/100 Pt reports SOB d/t talking to this RN, has increased SOB when walking around the house  Pt has to use her Albuterol inhaler about 2x per day since having COVID Pneumonia in mid January  Pt checks her BP 3-4 times per day. She states her goal is to titrate off the Hydralazine as soon as possible as she suspects this is causing her swelling/SOB.  Pt would like to note she can not take Ca Ch blockers or diuretics   She would like to know exactly what BP parameters she should reach out to Dr. Bettina Gavia with regarding her med chagnes? Pt states it is ok for [facility] to call her directly at [phone] if possible

## 2019-12-28 NOTE — Telephone Encounter (Signed)
-----   Message from Richardo Priest, MD sent at 12/28/2019 10:44 AM EDT ----- Normal or stable result  No changes

## 2020-01-02 ENCOUNTER — Other Ambulatory Visit: Payer: Self-pay

## 2020-01-02 ENCOUNTER — Ambulatory Visit (INDEPENDENT_AMBULATORY_CARE_PROVIDER_SITE_OTHER): Payer: 59 | Admitting: Pharmacist

## 2020-01-02 VITALS — BP 184/110 | HR 75

## 2020-01-02 DIAGNOSIS — I10 Essential (primary) hypertension: Secondary | ICD-10-CM | POA: Diagnosis not present

## 2020-01-02 MED ORDER — IRBESARTAN 150 MG PO TABS: 150.0000 mg | ORAL_TABLET | Freq: Every day | ORAL | 11 refills | Status: DC

## 2020-01-02 MED FILL — IRBESARTAN 150 MG TAB: 150 | 30 days supply | Qty: 30 | Fill #0

## 2020-01-02 NOTE — Progress Notes (Deleted)
Patient ID: Vrinda Nassiri                 DOB: [birth year]                      MRN: [id number]     HPI: Lindsay Burke is a 49 y.o. female referred by Dr. Bettina Gavia to HTN clinic. PMH is significant for HTN, mitral valve prolapse, premature CAD, and asthma. Onset of HTN was 12 years ago with preeclampsia of pregnancy. She was initially placed on nifedipine which cause a hypertensive crisis, so she was switched to labetalol. Patient was seen via telemedicine by Dr. Bettina Gavia on 10/25/19 where she reported a paradoxical response to spironolactone and therefore stopped the medication, which resulted in hypotension while on hydralazine. She then adjusted her hydralazine from 12.5mg  TID to BID.  Patient was last seen by Dr. Bettina Gavia on 12/21/19 where BP was well controlled at 118/78. She expressed a desire to switch her beta-blocker to carvedilol, so her labetalol was switched to carvedilol 25mg  BID. She also wanted to stop clonidine, so she was instructed to decrease dose to 0.1mg  daily from BID if her BP remained <140/90 after 2 weeks. On 12/26/19, pt sent a MyChart message complaining of more SOB and bilateral LEE and was concerned this was due to hydralazine or clonidine. She then got an ECHO on 12/27/19 which showed LVEF 60-65%.   On 01/02/20, she sent another MyChart message stating that she stopped taking hydralazine due to her LEE and SOB, and symptoms resolved afterwards. She also reported resolution of sedation and hallucinatory nightmares after switching labetalol to carvedilol. She stated that her BP is 140/100 in the morning which comes down to 120/90 after taking her morning BP medications. She was instructed to increase her carvedilol to 37.5mg  BID. She would like to know if a clonidine patch would be beneficial.  Patient arrives today for initial visit. She endorses many intolerances to BP-lowering medications in the past. She states that nifedipine, chlorthalidone, and spironolactone caused swelling and  increased her BP.   She w She has tried many BP-lowering medications in the past  Spiro: BP 184/110 Chlorthalidone:     Arlyce Harman 25: within first week, blocking other medications > BP 184/110; came back to normal when (110/70) stopped Chlorthalidone:  hydral 12.5 bid: Coreg: stopped last week Clonidine: (sedation) Tend to run higher in the afternoon No side effects CNS  Ankles swelling start January: hydralazine >> within 2 days, swelling and SOB completely gone  Home: 120/90, 140/100 this morning >> migraine >> 160/104 Meds at 0900 today (37.5 coreg, 0.1 clonidine) Symptomatic: flushed, don't feel good COVID vaccine this Friday, very sick but  Took ibuprofen > BP didn't go up  - Mychart: BP today 160/104, HR 88 - When did she incr coreg - Allergies: chlorthalidone, spironolactone, nifedipine - NSAID use?  Tylenol arthritis (BP went up, couldn't breathe, LEE swelling)  Norethindrone thinks is causing BP too  - Taper off clonidine > daily for 1 week then stop - Try chlorthalidone 12.5 daily if swelling - dizziness when sitting still, triggered when move around (190s/110) - Amlodipine, but hx LEE and nifedipine allergy (lost vision, black and fuzzy, sudden severe headache, 220/140 b>  one dose in the morning and the next night she was in the ER - losartan? Dry cough after a few days; constant; lasted until resolved but doesn't remember when  Thiazide: ineffective; chlorthalidone allergy? > chlorthalidone 12.5 qd ACE/ARB: cough? > irbesartan 150  qd Amlodipine: nifedipine HTN crisis, hx LEE Spiro: allergy  Last prednisone: January Phentermine-topiramate   Thyroid levels were fine a month ago  Took 2 800mg  ibuprofen this weekend because eof covid vaccine befor ethe weekend, mid feb for covid vaccine  Manually takes BP by herself - pumps with her measuring hand  Home cuff: 200/120 mmHg > 210/118    Current HTN meds: carvedilol 25mg  BID (10/10), clonidine 0.1mg  BID  (8:30/8pm)  Previously tried: spironolactone 25mg  daily, labetalol 400mg  TID (CNS side effects), clonidine 0.1mg  daily, hydralazine 12.5mg  BID (SOB, LEE)  BP goal: < 130/80 mmHg  Family History: The patient's family history includes Heart attack in her father; Hypertension in her father and mother.  Social History: Never smoker, does not drink alcohol  Diet: Does not add salt to food; enjoys eating vegetables, spinach, lean meats, salmon, chicken; drinks 1 caramel iced coffee from [employer] daily  Exercise: limited post-COVID infection; used to walk 3-5 miles on the treadmill daily before COVID infection.  Home BP readings: 120/90 in morning before meds > meds at 9, 140/90 in PM, 160/100 last week 140/ Swelling is down  Wt Readings from Last 3 Encounters:  12/21/19 167 lb (75.8 kg)  10/27/19 160 lb 15 oz (73 kg)  10/25/19 161 lb (73 kg)   BP Readings from Last 3 Encounters:  12/21/19 118/78  10/28/19 110/74  10/25/19 130/90   Pulse Readings from Last 3 Encounters:  12/21/19 79  10/28/19 87  10/25/19 71    Renal function: Estimated Creatinine Clearance: 72.9 mL/min (by C-G formula based on SCr of 0.93 mg/dL).  Past Medical History:  Diagnosis Date  . Asthma   . Hypertension   . Thyroid disease     Current Outpatient Medications on File Prior to Visit  Medication Sig Dispense Refill  . acetaminophen (TYLENOL) 500 MG tablet Take 1 tablet by mouth as needed.    Marland Kitchen albuterol (VENTOLIN HFA) 108 (90 Base) MCG/ACT inhaler Inhale 2 puffs into the lungs every 4 (four) hours as needed.    . carvedilol (COREG) 25 MG tablet Take 1 tablet (25 mg total) by mouth 2 (two) times daily with a meal. 180 tablet 3  . cetirizine (ZYRTEC) 10 MG tablet Take 5 mg by mouth daily.    . Cholecalciferol (VITAMIN D3) 1.25 MG (50000 UT) TABS Take 1 tablet by mouth once a week.    . cloNIDine (CATAPRES) 0.1 MG tablet Take 0.1 mg by mouth 2 (two) times daily.    . fluticasone (FLONASE) 50 MCG/ACT nasal  spray SPRAY 1 SPRAY INTO EACH NOSTRIL EVERY DAY    . fluticasone (FLOVENT HFA) 110 MCG/ACT inhaler Inhale 2 puffs into the lungs 2 (two) times daily. 1 Inhaler 0  . hydrALAZINE (APRESOLINE) 25 MG tablet Take 12.5 mg by mouth 2 (two) times daily.    . norethindrone (AYGESTIN) 5 MG tablet Take 5 mg by mouth at bedtime.    . [DISCONTINUED] spironolactone (ALDACTONE) 25 MG tablet Take 1 tablet (25 mg total) by mouth daily. 30 tablet 2   No current facility-administered medications on file prior to visit.    Allergies  Allergen Reactions  . Acetaminophen Hypertension, Other (See Comments) and Rash    Tylenol arthritis puts into CHF   . Clindamycin/Lincomycin Diarrhea  . Chlorthalidone Other (See Comments)    HTN HTN HTN   . Nifedipine Other (See Comments) and Rash    Other reaction(s): Unknown Other reaction(s): Other (See Comments) High BP High BP High BP  Other reaction(s): Unknown Other reaction(s): Other (See Comments) High BP High BP High BP Other reaction(s): Unknown Other reaction(s): Other (See Comments) High BP Other reaction(s): Unknown Other reaction(s): Other (See Comments) High BP High BP   . Spironolactone Hypertension     Assessment/Plan:  1. Hypertension - BP is elevated and above goal of < 130/80 mmHg. Start irbesartan 150mg  daily.

## 2020-01-02 NOTE — Telephone Encounter (Signed)
Called Kiersta back regarding her recent [place] stating she may need to be seen today due to her BP rising. Spoke with Jinny Blossom in HTN clinic who has agreed to fit the patient in today at 3pm. Pt made aware.

## 2020-01-02 NOTE — Progress Notes (Addendum)
Patient ID: Lindsay Burke                 DOB: 08-25-1971                      MRN: [id number]     HPI: Lindsay Burke is a 49 y.o. female referred by Dr. Bettina Gavia to HTN clinic. PMH is significant for HTN, mitral valve prolapse, premature CAD, and asthma. Onset of HTN was 12 years ago with preeclampsia of pregnancy. She was initially placed on nifedipine which cause a hypertensive crisis, so she was switched to labetalol. Patient was seen via telemedicine by Dr. Bettina Gavia on 10/25/19 where she reported a paradoxical response to spironolactone and therefore stopped the medication, which resulted in hypotension while on hydralazine. She then adjusted her hydralazine from 12.5mg  TID to BID.  Patient was last seen by Dr. Bettina Gavia on 12/21/19 where BP was well controlled at 118/78. She expressed a desire to switch her beta-blocker to carvedilol, so her labetalol was switched to carvedilol 25mg  BID. She also wanted to stop clonidine, so she was instructed to decrease dose to 0.1mg  daily from BID if her BP remained <140/90 after 2 weeks. On 12/26/19, pt sent a MyChart message complaining of more SOB and bilateral LEE and was concerned this was due to hydralazine or clonidine. She then got an ECHO on 12/27/19 which showed LVEF 60-65%.   Today, she sent another MyChart message stating that she stopped taking hydralazine due to her LEE and SOB, and symptoms resolved afterwards. She also reported resolution of sedation and hallucinatory nightmares after switching labetalol to carvedilol. She stated that her BP is 140/100 in the morning which comes down to 120/90 after taking her morning BP medications. She was instructed to increase her carvedilol to 37.5mg  BID and was referred to HTN clinic.  Patient arrives today for initial visit. She endorses many intolerances to BP-lowering medications in the past, and she states that all of these side effects resolve upon discontinuation of the medication. She measures her BP at home  multiple times daily. She uses a manual BP cuff at home by herself which likely does not allow her arm to relax while being measured. She used to use a wrist cuff but stopped using it due to inaccurate BP readings. She took her morning medications at 9am today. Her BP prior to taking her medications was 120/90, then 140/90 after taking medications. She then had a migraine, and her BP was 160/104 upon recheck. Her BP tends to increase throughout the day, and she is able to tell when her BP gets high due to flushing and overall feeling unwell.   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 She wonders if her COVID infection in January and most recent COVID vaccine is affecting her BP. Of note, she took two 800mg  doses of ibuprofen this weekend due to side effects after the COVID vaccine. She states that in the past, Tylenol Arthritis increased her BP, had LEE, and could not breathe. However, she is able to take regular Tylenol with no side effects. Her physical activity has been limited due to SOB 2/2 COVID infection in January. She has used phentermine in the past for weight loss (most recently Fall 2020) and would like to try this again once her BP is more controlled. She endorses a low-sodium diet, but does drink a [employer] coffee regularly.  Home BP cuff (manual): 200/120 mmHg by patient, 210/118 on recheck Clinic BP cuff: 184/110 mmHg  Current HTN meds: carvedilol 37.5mg  BID (10am/10pm), clonidine 0.1mg  BID (8:30am/8pm)  Previously tried:  - Chlorthalidone: increased BP with severe dizziness at rest - Losartan: dry cough - Nifedipine: sudden, severe headache, black/fuzzy vision, increased BP (during pregnancy) - Spironolactone: increased BP - Hydralazine: SOB, LEE - Labetalol: hallucinatory nightmares - Clonidine 0.1mg  daily: fatigue  BP goal: < 130/80 mmHg  Family History: The patient's family history includes Heart attack  in her father; Hypertension in her father and mother.  Social History: Never smoker, does not drink alcohol  Diet: Does not add salt to food; enjoys eating vegetables, spinach, lean meats, salmon, chicken; drinks 1 caramel iced coffee from [employer] daily  Exercise: limited post-COVID infection; used to walk 3-5 miles on the treadmill daily before COVID infection.  Home BP readings: Typically ranges SBP 120-140s with some readings in 160s. DBP 90-100. BP typically increases throughout the day. Measures BP using manual cuff by herself. Measures BP before taking morning BP medications.   Wt Readings from Last 3 Encounters:  12/21/19 167 lb (75.8 kg)  10/27/19 160 lb 15 oz (73 kg)  10/25/19 161 lb (73 kg)   BP Readings from Last 3 Encounters:  12/21/19 118/78  10/28/19 110/74  10/25/19 130/90   Pulse Readings from Last 3 Encounters:  12/21/19 79  10/28/19 87  10/25/19 71    Renal function: Estimated Creatinine Clearance: 72.9 mL/min (by C-G formula based on SCr of 0.93 mg/dL).  Past Medical History:  Diagnosis Date  . Asthma   . Hypertension   . Thyroid disease     Current Outpatient Medications on File Prior to Visit  Medication Sig Dispense Refill  . acetaminophen (TYLENOL) 500 MG tablet Take 1 tablet by mouth as needed.    Marland Kitchen albuterol (VENTOLIN HFA) 108 (90 Base) MCG/ACT inhaler Inhale 2 puffs into the lungs every 4 (four) hours as needed.    . carvedilol (COREG) 25 MG tablet Take 1 tablet (25 mg total) by mouth 2 (two) times daily with a meal. 180 tablet 3  . cetirizine (ZYRTEC) 10 MG tablet Take 5 mg by mouth daily.    . Cholecalciferol (VITAMIN D3) 1.25 MG (50000 UT) TABS Take 1 tablet by mouth once a week.    . cloNIDine (CATAPRES) 0.1 MG tablet Take 0.1 mg by mouth 2 (two) times daily.    . fluticasone (FLONASE) 50 MCG/ACT nasal spray SPRAY 1 SPRAY INTO EACH NOSTRIL EVERY DAY    . fluticasone (FLOVENT HFA) 110 MCG/ACT inhaler Inhale 2 puffs into the lungs 2 (two) times  daily. 1 Inhaler 0  . hydrALAZINE (APRESOLINE) 25 MG tablet Take 12.5 mg by mouth 2 (two) times daily.    . norethindrone (AYGESTIN) 5 MG tablet Take 5 mg by mouth at bedtime.    . [DISCONTINUED] spironolactone (ALDACTONE) 25 MG tablet Take 1 tablet (25 mg total) by mouth daily. 30 tablet 2   No current facility-administered medications on file prior to visit.    Allergies  Allergen Reactions  . Acetaminophen Hypertension, Other (See Comments) and Rash    Tylenol arthritis puts into CHF   . Clindamycin/Lincomycin Diarrhea  . Chlorthalidone Other (See Comments)    HTN HTN HTN   . Nifedipine Other (See Comments) and Rash    Other reaction(s): Unknown Other reaction(s): Other (See Comments) High BP High BP High BP Other reaction(s): Unknown Other reaction(s): Other (See Comments) High  BP High BP High BP Other reaction(s): Unknown Other reaction(s): Other (See Comments) High BP Other reaction(s): Unknown Other reaction(s): Other (See Comments) High BP High BP   . Spironolactone Hypertension     Assessment/Plan:  1. Hypertension - BP is extremely elevated and above goal of < 130/80 mmHg. Pt's HTN is likely multifactorial from stress of everyday activities, NSAID use, and recent COVID infection. Unsure if previous BP medication intolerances are from medications or uncontrolled HTN itself. Start irbesartan 150mg  daily in the afternoon to provide better PM coverage. Can consider trying HCTZ to test tolerability if she becomes intolerant to irbesartan. Decrease carvedilol back to 25mg  BID since higher doses tend to have more side effects and not much additional BP-lowering. Continue clonidine 0.1mg  BID for 3 more days, then decrease to 0.1mg  daily since this medication is not first-line antihypertensive therapy. Instructed patient to stop measuring BP manually and to start using a bicep digital BP cuff. Emphasized the importance of measuring her BP after she takes her BP  medications and staying seated and relaxed for 5-10 minutes prior in order to get the most accurate reading. Discussed the importance of maintaining a regular exercise regimen and sticking to a heart healthy diet low in sodium and caffeine. Will follow-up in office on 01/17/20 to recheck BP, compare new home BP cuff to clinic cuff, and recheck BMET. 1.5 hours spent with patient at today's visit.  Richardine Service, PharmD PGY1 Pharmacy Resident

## 2020-01-02 NOTE — Patient Instructions (Addendum)
It was nice to meet you today   Your blood pressure goal is less than 130/110mmHg  START taking irbesartan 150mg  daily  Continue taking clonidine 0.1mg  BID for 3 MORE DAYS, then DECREASE your clonidine to 0.1mg  daily.   DECREASE your carvedilol back to 25mg  BID.    Pick up a bicep digital BP cuff and continue to monitor your blood pressure at home. Bring your readings and your home cuff to your next appointment so that we can compare it to our clinic cuff.  If you need OTC pain medications, take acetaminophen. Avoid using NSAIDs such as ibuprofen.  Please call Altha Harm or Jinny Blossom at [phone] if you have any questions.

## 2020-01-02 NOTE — Telephone Encounter (Signed)
Spoke with the patient regarding Dr. Joya Gaskins advice for the patient to be seen by htn clinic dt the complexity of her situations. Pt agreeable to Wed 10:30 am appt. PharmD made aware.

## 2020-01-03 MED FILL — VIT D2 1.25 MG (50,000 UNIT: 1.25 MG | 84 days supply | Qty: 12 | Fill #0

## 2020-01-04 ENCOUNTER — Ambulatory Visit: Payer: 59

## 2020-01-05 ENCOUNTER — Telehealth: Payer: Self-pay | Admitting: Pharmacist

## 2020-01-05 DIAGNOSIS — I10 Essential (primary) hypertension: Secondary | ICD-10-CM | POA: Diagnosis not present

## 2020-01-05 NOTE — Telephone Encounter (Signed)
Patient called stating that she was supposed to decrease her clonidine today, but she's not sure she should since her blood pressure is 150-160's/104. She started irbesartan on Monday.  She was switched from labetalol to carvedilol on 3/15. She noticed that her fatigue (once thought to be from clonidine) has improved a lot since stopping labetalol. However she now reports that she is getting ophthalmic migraines with the carvedilol. She has a hx of these, but typically only gets 1-2 per year, but has had 3 this week. These migraines have been severe and they started before starting irbesartan. Patient has had difficult to control blood pressure for 12 years. She recently was controlled on labetalol, clonidine and hydralazine. However hydralazine was causing SOB and swelling. Once stopping her SOB and swelling significantly improved.   Of note her BP runs higher in the afternoon and evening, therefore irbesartan was advised to take in the afternoon. She also seems to be tolerating better. She states her nightmares and fatigue are also better off of labetalol.   I advised that I agree with patient, that we cannot titrate off of clonidine at this point. She will continue medications as is for now. She will come in for a BMP today. If this is stable we will plan to increase irbesartan to 300mg  daily and decrease carvedilol to 12.5mg  BID to see if this helps her headaches. Can discuss clonidine patch in the future. Patient also desires a secondary workout. She is concerned about possible parathyroid issue. However her serum calcium is normal. I will defer this to endocrinology. I will however order a metanephrines to rule of pheochromocytoma. Patient does have an elevated TSH but normal T4. She is followed by endo already. Has diagnosis of Hashimoto's. Will call patient tomorrow with results and finalize plan.

## 2020-01-06 ENCOUNTER — Telehealth: Payer: Self-pay | Admitting: Pharmacist

## 2020-01-06 ENCOUNTER — Telehealth: Payer: Self-pay

## 2020-01-06 DIAGNOSIS — I10 Essential (primary) hypertension: Secondary | ICD-10-CM

## 2020-01-06 MED ORDER — IRBESARTAN 300 MG PO TABS: 300.0000 mg | ORAL_TABLET | Freq: Every day | ORAL | 11 refills | Status: DC

## 2020-01-06 MED FILL — IRBESARTAN 300 MG TABS: 300 | 30 days supply | Qty: 30 | Fill #0

## 2020-01-06 NOTE — Telephone Encounter (Signed)
Scr and K are stable. Will increase irbesartan to 300mg  daily. Decrease carvedilol to 12.5mg  twice a day and continue clonidine 0.1mg  BID. Will follow up on 4/6, patient requested virtual appointment. Will check a BMP then as well. Pt can go to [facility] for lab.  Patient aware of results and in agreement with plan. She will call if she has any issues in the interim. If it appears she will need clonidine long term and will not be able to get her off, then will consider clonidine patch.

## 2020-01-06 NOTE — Telephone Encounter (Signed)
-----   Message from Richardo Priest, MD sent at 01/06/2020 11:41 AM EDT ----- Normal or stable result  Renal function potassium are normal no changes in treatment

## 2020-01-06 NOTE — Telephone Encounter (Signed)
lpmtcb 3/26

## 2020-01-09 MED FILL — CLONIDINE HCL 0.1 MG TABS: 0.1 | 30 days supply | Qty: 60 | Fill #5

## 2020-01-16 LAB — METANEPHRINES, PLASMA
Metanephrine, Free: 22.8 pg/mL (ref 0.0–88.0)
Normetanephrine, Free: 117.1 pg/mL (ref 0.0–125.8)

## 2020-01-16 LAB — BASIC METABOLIC PANEL
BUN/Creatinine Ratio: 15 (ref 9–23)
BUN: 11 mg/dL (ref 6–24)
CO2: 19 mmol/L — ABNORMAL LOW (ref 20–29)
Calcium: 9.1 mg/dL (ref 8.7–10.2)
Chloride: 107 mmol/L — ABNORMAL HIGH (ref 96–106)
Creatinine, Ser: 0.72 mg/dL (ref 0.57–1.00)
GFR calc Af Amer: 114 mL/min/{1.73_m2} (ref >59)
GFR calc non Af Amer: 99 mL/min/{1.73_m2} (ref >59)
Glucose: 104 mg/dL — ABNORMAL HIGH (ref 65–99)
Potassium: 4.2 mmol/L (ref 3.5–5.2)
Sodium: 139 mmol/L (ref 134–144)

## 2020-01-17 ENCOUNTER — Other Ambulatory Visit: Payer: Self-pay

## 2020-01-17 ENCOUNTER — Ambulatory Visit: Payer: 59

## 2020-01-17 ENCOUNTER — Telehealth (INDEPENDENT_AMBULATORY_CARE_PROVIDER_SITE_OTHER): Payer: 59 | Admitting: Pharmacist

## 2020-01-17 DIAGNOSIS — I10 Essential (primary) hypertension: Secondary | ICD-10-CM

## 2020-01-17 MED FILL — IRBESARTAN 300 MG TABS: 300 | 30 days supply | Qty: 30 | Fill #0

## 2020-01-17 NOTE — Addendum Note (Signed)
Addended by: Marcelle Overlie D on: 01/17/2020 03:39 PM   Modules accepted: Orders

## 2020-01-17 NOTE — Progress Notes (Signed)
Patient ID: Lindsay Burke                 DOB: Apr 03, 1971                      MRN: [id number]     HPI: Lindsay Burke is a 49 y.o. female referred by Dr. Bettina Gavia to HTN clinic. PMH is significant for HTN, mitral valve prolapse, premature CAD, and asthma. Onset of HTN was 12 years ago with preeclampsia of pregnancy. She was initially placed on nifedipine which cause a hypertensive crisis, so she was switched to labetalol. Patient was seen via telemedicine by Dr. Bettina Gavia on 10/25/19 where she reported a paradoxical response to spironolactone and therefore stopped the medication, which resulted in hypotension while on hydralazine. She then adjusted her hydralazine from 12.5mg  TID to BID.  Patient was last seen by Dr. Bettina Gavia on 12/21/19 where BP was well controlled at 118/78. She expressed a desire to switch her beta-blocker to carvedilol, so her labetalol was switched to carvedilol 25mg  BID. She also wanted to stop clonidine, so she was instructed to decrease dose to 0.1mg  daily from BID if her BP remained <140/90 after 2 weeks. On 12/26/19, pt sent a MyChart message complaining of more SOB and bilateral LEE and was concerned this was due to hydralazine or clonidine. She then got an ECHO on 12/27/19 which showed LVEF 60-65%.   She sent another MyChart message stating that she stopped taking hydralazine due to her LEE and SOB, and symptoms resolved afterwards. She also reported resolution of sedation and hallucinatory nightmares after switching labetalol to carvedilol.   At last HTN visit on 3/22 patient's blood pressure was extremely elevated and she was started in irbesartan 150mg  daily. She was instructed to decrease clonidine to 0.1mg  daily after 3 days. However patient called clinic on 3/25 stating that her blood pressure was still in the 140-150/104 and she did not want to decrease clonidine as she did not feel as though her blood pressure was under good enough control. Patient had a BMP drawn that day that was  stable, therefore irbesartan was increased to 300mg  daily. She states that the side effects she thought were from clonidine have mostly resolved and she expressed interest in staying on clonidine and switching to the patch. She also thought carvedilol was causing her ophthalmic headaches to be worse, so we discussed decreasing carvedilol to 12.5mg ,  Patient was called today for follow up on blood pressure. She states that she did not decrease carvedilol to 12.5mg  because she did not feel like her blood pressure was under good enough control. She is still checking her blood pressure manually since it is more accurate.  Patient states her fatigue from clonidine has come back since increase irbesartan. States one day she was out and didn't have her clonidine and she felt like she had so much more energy. Now is interested in getting off of clonidine. Still having headaches that she attributes to carvedilol.  Current HTN meds: carvedilol 25mg  BID (10am/10pm), clonidine 0.1mg  BID (8:30am/8pm), irbesartan 300mg  daily (8AM)  Previously tried:  - Chlorthalidone: increased BP with severe dizziness at rest - Losartan: dry cough - Nifedipine: sudden, severe headache, black/fuzzy vision, increased BP (during pregnancy) - Spironolactone: increased BP - Hydralazine: SOB, LEE - Labetalol: hallucinatory nightmares - Clonidine 0.1mg  daily: fatigue  BP goal: < 130/80 mmHg  Family History: The patient's family history includes Heart attack in her father; Hypertension in her father and mother.  Social History: Never smoker, does not drink alcohol  Diet: Does not add salt to food; enjoys eating vegetables, spinach, lean meats, salmon, chicken; drinks 1 caramel iced coffee from [employer] daily  Exercise: limited post-COVID infection; used to walk 3-5 miles on the treadmill daily before COVID infection.  Home BP readings: 140/90-160/100 (AM before meds) 122/82,130/90 (noon) 140/90 in the evenings Measures BP before  taking morning BP medications.   Wt Readings from Last 3 Encounters:  12/21/19 167 lb (75.8 kg)  10/27/19 160 lb 15 oz (73 kg)  10/25/19 161 lb (73 kg)   BP Readings from Last 3 Encounters:  01/02/20 (!) 184/110  12/21/19 118/78  10/28/19 110/74   Pulse Readings from Last 3 Encounters:  01/02/20 75  12/21/19 79  10/28/19 87    Renal function: CrCl cannot be calculated (Unknown ideal weight.).  Past Medical History:  Diagnosis Date  . Asthma   . Hypertension   . Thyroid disease     Current Outpatient Medications on File Prior to Visit  Medication Sig Dispense Refill  . acetaminophen (TYLENOL) 500 MG tablet Take 1 tablet by mouth as needed.    Marland Kitchen albuterol (VENTOLIN HFA) 108 (90 Base) MCG/ACT inhaler Inhale 2 puffs into the lungs every 4 (four) hours as needed.    . carvedilol (COREG) 25 MG tablet Take 0.5 tablets (12.5 mg total) by mouth 2 (two) times daily with a meal. 180 tablet 3  . cetirizine (ZYRTEC) 10 MG tablet Take 10 mg by mouth daily.     . Cholecalciferol (VITAMIN D3) 1.25 MG (50000 UT) TABS Take 1 tablet by mouth once a week.    . cloNIDine (CATAPRES) 0.1 MG tablet Take 0.1 mg by mouth 2 (two) times daily.    . fluticasone (FLONASE) 50 MCG/ACT nasal spray SPRAY 1 SPRAY INTO EACH NOSTRIL EVERY DAY    . fluticasone (FLOVENT HFA) 110 MCG/ACT inhaler Inhale 2 puffs into the lungs 2 (two) times daily. 1 Inhaler 0  . irbesartan (AVAPRO) 300 MG tablet Take 1 tablet (300 mg total) by mouth daily. 30 tablet 11  . norethindrone (AYGESTIN) 5 MG tablet Take 5 mg by mouth at bedtime.    . [DISCONTINUED] spironolactone (ALDACTONE) 25 MG tablet Take 1 tablet (25 mg total) by mouth daily. 30 tablet 2   No current facility-administered medications on file prior to visit.    Allergies  Allergen Reactions  . Acetaminophen Hypertension, Other (See Comments) and Rash    Tylenol arthritis puts into CHF   . Clindamycin/Lincomycin Diarrhea  . Chlorthalidone Other (See Comments)      HTN HTN HTN   . Nifedipine Other (See Comments) and Rash    Other reaction(s): Unknown Other reaction(s): Other (See Comments) High BP High BP High BP Other reaction(s): Unknown Other reaction(s): Other (See Comments) High BP High BP High BP Other reaction(s): Unknown Other reaction(s): Other (See Comments) High BP Other reaction(s): Unknown Other reaction(s): Other (See Comments) High BP High BP   . Spironolactone Hypertension     Assessment/Plan:  1. Hypertension -Blood pressure is still above goal of <130/80. We discussed that she has had intolerances to all of our first, second and third line agents.Discussed trying different medication in same class. Dicussed trying HCTZ 12.5mg  daily. Since it is not as strong as chlorthalidone, hopefully it wont cause dizziness. Patient in agreement. Will start as long as BMP is stable. [facility] will go [facility] to have BMP drawn. Will call patient  with results and final medication plan. Will attempt to get patient off of clonidine as blood pressures improve.   Ramond Dial, Pharm.D, BCPS, CPP [facility]  [id number] [place], [place] [address]  Phone: [phone]; Fax: [phone]

## 2020-01-18 ENCOUNTER — Telehealth: Payer: Self-pay | Admitting: Pharmacist

## 2020-01-18 LAB — BASIC METABOLIC PANEL
BUN/Creatinine Ratio: 18 (ref 9–23)
BUN: 13 mg/dL (ref 6–24)
CO2: 20 mmol/L (ref 20–29)
Calcium: 9.5 mg/dL (ref 8.7–10.2)
Chloride: 104 mmol/L (ref 96–106)
Creatinine, Ser: 0.73 mg/dL (ref 0.57–1.00)
GFR calc Af Amer: 112 mL/min/{1.73_m2} (ref >59)
GFR calc non Af Amer: 97 mL/min/{1.73_m2} (ref >59)
Glucose: 92 mg/dL (ref 65–99)
Potassium: 4 mmol/L (ref 3.5–5.2)
Sodium: 138 mmol/L (ref 134–144)

## 2020-01-18 MED ORDER — HYDROCHLOROTHIAZIDE 12.5 MG PO CAPS: 12.5000 mg | ORAL_CAPSULE | Freq: Every day | ORAL | 11 refills | Status: DC

## 2020-01-18 MED FILL — HYDROCHLOROTHIAZIDE 12.5 MG: 12.5 | 30 days supply | Qty: 30 | Fill #0

## 2020-01-18 NOTE — Telephone Encounter (Signed)
BMET stable. Called pt to advise her to start HCTZ 12.5mg  daily as discussed at visit yesterday. Scheduled f/u virtual visit in 2 weeks.

## 2020-01-21 DIAGNOSIS — Z20822 Contact with and (suspected) exposure to covid-19: Secondary | ICD-10-CM | POA: Diagnosis not present

## 2020-01-21 DIAGNOSIS — R198 Other specified symptoms and signs involving the digestive system and abdomen: Secondary | ICD-10-CM | POA: Diagnosis not present

## 2020-02-02 ENCOUNTER — Telehealth (INDEPENDENT_AMBULATORY_CARE_PROVIDER_SITE_OTHER): Payer: 59 | Admitting: Pharmacist

## 2020-02-02 ENCOUNTER — Other Ambulatory Visit: Payer: Self-pay

## 2020-02-02 VITALS — BP 102/70

## 2020-02-02 DIAGNOSIS — I10 Essential (primary) hypertension: Secondary | ICD-10-CM

## 2020-02-02 MED ORDER — CARVEDILOL 6.25 MG PO TABS: ORAL_TABLET | ORAL | 0 refills | Status: DC

## 2020-02-02 MED FILL — CARVEDILOL 6.25 MG TABLET: 6.25 | 14 days supply | Qty: 21 | Fill #0

## 2020-02-02 NOTE — Progress Notes (Signed)
Patient ID: Lindsay Burke                 DOB: 01-27-1971                      MRN: [id number]     HPI: [name] is a 49 y.o. female referred by Dr. Bettina Gavia to HTN clinic. PMH is significant for HTN, mitral valve prolapse, premature CAD, and asthma. Onset of HTN was 12 years ago with preeclampsia of pregnancy. She was initially placed on nifedipine which cause a hypertensive crisis, so she was switched to labetalol. Patient was seen via telemedicine by Dr. Bettina Gavia on 10/25/19 where she reported a paradoxical response to spironolactone and therefore stopped the medication, which resulted in hypotension while on hydralazine. She then adjusted her hydralazine from 12.5mg  TID to BID.  Patient was last seen by Dr. Bettina Gavia on 12/21/19 where BP was well controlled at 118/78. She expressed a desire to switch her beta-blocker to carvedilol, so her labetalol was switched to carvedilol 25mg  BID. She also wanted to stop clonidine, so she was instructed to decrease dose to 0.1mg  daily from BID if her BP remained <140/90 after 2 weeks. On 12/26/19, pt sent a MyChart message complaining of more SOB and bilateral LEE and was concerned this was due to hydralazine or clonidine. She then got an ECHO on 12/27/19 which showed LVEF 60-65%.   She sent another MyChart message stating that she stopped taking hydralazine due to her LEE and SOB, and symptoms resolved afterwards. She also reported resolution of sedation and hallucinatory nightmares after switching labetalol to carvedilol. Irbesartan 150mg  was started due to extremely elevated blood pressures and then titrated to 300mg .  At last HTN visit on 4/7 patients blood pressure was averaging 140-160's in the AM and about 120-130 at noon and 140/90 in the PM. Patient still complained of headaches she attributed to carvedilol and fatigue from clonidine. HCTZ 12.5mg  daily was started.  Patient follow up done via telephone call. She states that the HCTZ is working very well for her.  However, when she was taking all the meds as prescribed her blood pressure was too low. 80/60. She tried titrating off the clonidine- taking only the PM dose but the following evening her blood pressure would be high. She then decided to try to titrate off the carvedilol. However, I believe she titrated too quickly because about 2 days after she stopped she became very tachycardic walking up a few steps (HR 130) and 99 at rest. She resumed carvedilol at 12.5mg  and her heart rate improved. Blood pressure has been about 120/78. This AM was 102/70. She feels good. Would still like to get off of clonidine due to fatigue it causes.  Current HTN meds: carvedilol 25mg  BID (10am/10pm), clonidine 0.1mg  BID (8:30am/8pm), irbesartan 300mg  daily (8AM), HCTZ 12.5mg  daily  Previously tried:  - Chlorthalidone: increased BP with severe dizziness at rest - Losartan: dry cough - Nifedipine: sudden, severe headache, black/fuzzy vision, increased BP (during pregnancy) - Spironolactone: increased BP - Hydralazine: SOB, LEE - Labetalol: hallucinatory nightmares - Clonidine 0.1mg  daily: fatigue  BP goal: < 130/80 mmHg  Family History: The patient's family history includes Heart attack in her father; Hypertension in her father and mother.  Social History: Never smoker, does not drink alcohol  Diet: Does not add salt to food; enjoys eating vegetables, spinach, lean meats, salmon, chicken; drinks 1 caramel iced coffee from [employer] daily  Exercise: limited post-COVID infection; used to walk  3-5 miles on the treadmill daily before COVID infection.  Home BP readings: 102/70, 120/78  Wt Readings from Last 3 Encounters:  12/21/19 167 lb (75.8 kg)  10/27/19 160 lb 15 oz (73 kg)  10/25/19 161 lb (73 kg)   BP Readings from Last 3 Encounters:  01/02/20 (!) 184/110  12/21/19 118/78  10/28/19 110/74   Pulse Readings from Last 3 Encounters:  01/02/20 75  12/21/19 79  10/28/19 87    Renal function: CrCl cannot be  calculated (Unknown ideal weight.).  Past Medical History:  Diagnosis Date  . Asthma   . Hypertension   . Thyroid disease     Current Outpatient Medications on File Prior to Visit  Medication Sig Dispense Refill  . acetaminophen (TYLENOL) 500 MG tablet Take 1 tablet by mouth as needed.    Marland Kitchen albuterol (VENTOLIN HFA) 108 (90 Base) MCG/ACT inhaler Inhale 2 puffs into the lungs every 4 (four) hours as needed.    . carvedilol (COREG) 25 MG tablet Take 25 mg by mouth 2 (two) times daily with a meal. 180 tablet 3  . cetirizine (ZYRTEC) 10 MG tablet Take 10 mg by mouth daily.     . Cholecalciferol (VITAMIN D3) 1.25 MG (50000 UT) TABS Take 1 tablet by mouth once a week.    . cloNIDine (CATAPRES) 0.1 MG tablet Take 0.1 mg by mouth 2 (two) times daily.    . fluticasone (FLONASE) 50 MCG/ACT nasal spray SPRAY 1 SPRAY INTO EACH NOSTRIL EVERY DAY    . fluticasone (FLOVENT HFA) 110 MCG/ACT inhaler Inhale 2 puffs into the lungs 2 (two) times daily. 1 Inhaler 0  . hydrochlorothiazide (MICROZIDE) 12.5 MG capsule Take 1 capsule (12.5 mg total) by mouth daily. 30 capsule 11  . irbesartan (AVAPRO) 300 MG tablet Take 1 tablet (300 mg total) by mouth daily. 30 tablet 11  . norethindrone (AYGESTIN) 5 MG tablet Take 5 mg by mouth at bedtime.    . [DISCONTINUED] spironolactone (ALDACTONE) 25 MG tablet Take 1 tablet (25 mg total) by mouth daily. 30 tablet 2   No current facility-administered medications on file prior to visit.    Allergies  Allergen Reactions  . Acetaminophen Hypertension, Other (See Comments) and Rash    Tylenol arthritis puts into CHF   . Clindamycin/Lincomycin Diarrhea  . Chlorthalidone Other (See Comments)    HTN HTN HTN   . Nifedipine Other (See Comments) and Rash    Other reaction(s): Unknown Other reaction(s): Other (See Comments) High BP High BP High BP Other reaction(s): Unknown Other reaction(s): Other (See Comments) High BP High BP High BP Other reaction(s):  Unknown Other reaction(s): Other (See Comments) High BP Other reaction(s): Unknown Other reaction(s): Other (See Comments) High BP High BP   . Spironolactone Hypertension     Assessment/Plan:  1. Hypertension -Blood pressure is at goal of <130/80. Patient would like to get off of both carvedilol or clonidine. We discussed that she may have titrated carvedilol too fast and caused rebound tachycardia. I have instructed patient to take carvedilol 12.5mg  twice a day for 7 days, then take 6.25mg  twice a day for 5-7 days then take 3.125mg  twice a day for 5-7 days then stop. If she becomes tachycardic after finishing the titration, then we will need to resume at low dose. Will follow up with patient via telephone on 5/10 to see how she is doing. At that point we will discuss titration off of clonidine. Will most likely need to increase HCTZ to 25mg   to achieve this. Patient is to get a BMP done today since HCTZ was started a few weeks ago. Patient knows she can call in the interim if she is having any issues.    Ramond Dial, Pharm.D, BCPS, CPP [place] [facility]  [id number], [place], [place] [address]  Phone: [phone]; Fax: [phone]

## 2020-02-03 ENCOUNTER — Telehealth: Payer: Self-pay

## 2020-02-03 LAB — BASIC METABOLIC PANEL
BUN/Creatinine Ratio: 17 (ref 9–23)
BUN: 15 mg/dL (ref 6–24)
CO2: 21 mmol/L (ref 20–29)
Calcium: 9.5 mg/dL (ref 8.7–10.2)
Chloride: 103 mmol/L (ref 96–106)
Creatinine, Ser: 0.86 mg/dL (ref 0.57–1.00)
GFR calc Af Amer: 92 mL/min/{1.73_m2} (ref >59)
GFR calc non Af Amer: 80 mL/min/{1.73_m2} (ref >59)
Glucose: 85 mg/dL (ref 65–99)
Potassium: 4.1 mmol/L (ref 3.5–5.2)
Sodium: 138 mmol/L (ref 134–144)

## 2020-02-03 NOTE — Telephone Encounter (Signed)
Spoke with patient regarding results and recommendation.  Patient verbalizes understanding and is agreeable to plan of care. Advised patient to call back with any issues or concerns.  

## 2020-02-09 DIAGNOSIS — M545 Low back pain: Secondary | ICD-10-CM | POA: Diagnosis not present

## 2020-02-09 DIAGNOSIS — M542 Cervicalgia: Secondary | ICD-10-CM | POA: Diagnosis not present

## 2020-02-09 DIAGNOSIS — M50122 Cervical disc disorder at C5-C6 level with radiculopathy: Secondary | ICD-10-CM | POA: Diagnosis not present

## 2020-02-09 DIAGNOSIS — M4722 Other spondylosis with radiculopathy, cervical region: Secondary | ICD-10-CM | POA: Diagnosis not present

## 2020-02-09 DIAGNOSIS — M40209 Unspecified kyphosis, site unspecified: Secondary | ICD-10-CM | POA: Diagnosis not present

## 2020-02-09 DIAGNOSIS — M50121 Cervical disc disorder at C4-C5 level with radiculopathy: Secondary | ICD-10-CM | POA: Diagnosis not present

## 2020-02-10 DIAGNOSIS — M5431 Sciatica, right side: Secondary | ICD-10-CM | POA: Diagnosis not present

## 2020-02-10 DIAGNOSIS — M2559 Pain in other specified joint: Secondary | ICD-10-CM | POA: Diagnosis not present

## 2020-02-10 DIAGNOSIS — M545 Low back pain: Secondary | ICD-10-CM | POA: Diagnosis not present

## 2020-02-10 DIAGNOSIS — M6281 Muscle weakness (generalized): Secondary | ICD-10-CM | POA: Diagnosis not present

## 2020-02-10 DIAGNOSIS — M542 Cervicalgia: Secondary | ICD-10-CM | POA: Diagnosis not present

## 2020-02-10 DIAGNOSIS — G894 Chronic pain syndrome: Secondary | ICD-10-CM | POA: Diagnosis not present

## 2020-02-14 DIAGNOSIS — M2559 Pain in other specified joint: Secondary | ICD-10-CM | POA: Diagnosis not present

## 2020-02-14 DIAGNOSIS — M545 Low back pain: Secondary | ICD-10-CM | POA: Diagnosis not present

## 2020-02-14 DIAGNOSIS — G894 Chronic pain syndrome: Secondary | ICD-10-CM | POA: Diagnosis not present

## 2020-02-14 DIAGNOSIS — M5431 Sciatica, right side: Secondary | ICD-10-CM | POA: Diagnosis not present

## 2020-02-14 DIAGNOSIS — M6281 Muscle weakness (generalized): Secondary | ICD-10-CM | POA: Diagnosis not present

## 2020-02-14 DIAGNOSIS — M542 Cervicalgia: Secondary | ICD-10-CM | POA: Diagnosis not present

## 2020-02-14 MED FILL — CLONIDINE HCL 0.1 MG TABS: 0.1 | 30 days supply | Qty: 45 | Fill #0

## 2020-02-14 MED FILL — IRBESARTAN 300 MG TAB: 300 | 30 days supply | Qty: 30 | Fill #1

## 2020-02-14 MED FILL — HYDROCHLOROTHIAZIDE 12.5 MG: 12.5 | 30 days supply | Qty: 30 | Fill #1

## 2020-02-15 ENCOUNTER — Other Ambulatory Visit (HOSPITAL_BASED_OUTPATIENT_CLINIC_OR_DEPARTMENT_OTHER): Payer: Self-pay | Admitting: Orthopedic Surgery

## 2020-02-15 DIAGNOSIS — M4722 Other spondylosis with radiculopathy, cervical region: Secondary | ICD-10-CM

## 2020-02-15 DIAGNOSIS — H6123 Impacted cerumen, bilateral: Secondary | ICD-10-CM | POA: Diagnosis not present

## 2020-02-15 DIAGNOSIS — J309 Allergic rhinitis, unspecified: Secondary | ICD-10-CM | POA: Diagnosis not present

## 2020-02-16 DIAGNOSIS — G894 Chronic pain syndrome: Secondary | ICD-10-CM | POA: Diagnosis not present

## 2020-02-16 DIAGNOSIS — M2559 Pain in other specified joint: Secondary | ICD-10-CM | POA: Diagnosis not present

## 2020-02-16 DIAGNOSIS — M5431 Sciatica, right side: Secondary | ICD-10-CM | POA: Diagnosis not present

## 2020-02-16 DIAGNOSIS — M6281 Muscle weakness (generalized): Secondary | ICD-10-CM | POA: Diagnosis not present

## 2020-02-16 DIAGNOSIS — M542 Cervicalgia: Secondary | ICD-10-CM | POA: Diagnosis not present

## 2020-02-16 DIAGNOSIS — M545 Low back pain: Secondary | ICD-10-CM | POA: Diagnosis not present

## 2020-02-17 DIAGNOSIS — J309 Allergic rhinitis, unspecified: Secondary | ICD-10-CM | POA: Diagnosis not present

## 2020-02-18 ENCOUNTER — Ambulatory Visit (HOSPITAL_BASED_OUTPATIENT_CLINIC_OR_DEPARTMENT_OTHER)
Admission: RE | Admit: 2020-02-18 | Discharge: 2020-02-18 | Disposition: A | Discharge status: Home / Self Care | Payer: 59 | Source: Ambulatory Visit | Attending: Orthopedic Surgery | Admitting: Orthopedic Surgery

## 2020-02-18 ENCOUNTER — Other Ambulatory Visit: Payer: Self-pay

## 2020-02-18 DIAGNOSIS — M4722 Other spondylosis with radiculopathy, cervical region: Secondary | ICD-10-CM | POA: Diagnosis not present

## 2020-02-18 DIAGNOSIS — M542 Cervicalgia: Secondary | ICD-10-CM | POA: Diagnosis not present

## 2020-02-20 ENCOUNTER — Telehealth (INDEPENDENT_AMBULATORY_CARE_PROVIDER_SITE_OTHER): Payer: 59 | Admitting: Pharmacist

## 2020-02-20 ENCOUNTER — Other Ambulatory Visit: Payer: Self-pay

## 2020-02-20 VITALS — BP 110/72 | HR 80

## 2020-02-20 DIAGNOSIS — I10 Essential (primary) hypertension: Secondary | ICD-10-CM

## 2020-02-20 NOTE — Progress Notes (Signed)
Patient ID: Lindsay Burke                 DOB: 1971/03/25                      MRN: [id number]     HPI: Lindsay Burke is a 49 y.o. female referred by Dr. Bettina Burke to HTN clinic. PMH is significant for HTN, mitral valve prolapse, premature CAD, and asthma. Onset of HTN was 12 years ago with preeclampsia of pregnancy. She was initially placed on nifedipine which cause a hypertensive crisis, so she was switched to labetalol. Patient was seen via telemedicine by Dr. Bettina Burke on 10/25/19 where she reported a paradoxical response to spironolactone and therefore stopped the medication, which resulted in hypotension while on hydralazine. She then adjusted her hydralazine from 12.5mg  TID to BID.  Patient was last seen by Dr. Bettina Burke on 12/21/19 where BP was well controlled at 118/78. She expressed a desire to switch her beta-blocker to carvedilol, so her labetalol was switched to carvedilol 25mg  BID. She also wanted to stop clonidine, so she was instructed to decrease dose to 0.1mg  daily from BID if her BP remained <140/90 after 2 weeks. On 12/26/19, pt sent a MyChart message complaining of more SOB and bilateral LEE and was concerned this was due to hydralazine or clonidine. She then got an ECHO on 12/27/19 which showed LVEF 60-65%.   She sent another MyChart message stating that she stopped taking hydralazine due to her LEE and SOB, and symptoms resolved afterwards. She also reported resolution of sedation and hallucinatory nightmares after switching labetalol to carvedilol. Irbesartan 150mg  was started due to extremely elevated blood pressures and then titrated to 300mg .  At last HTN visit on 4/22 patients blood pressure was at goal. She had actually attempted to stop the clonidine and carvedilol due to low blood pressure, however blood pressure and HR then were elevated. We discussed that she probably tapered too quickly. Patient was given a taper schedule to slowly decrease carvedilol. Goal is still to get patient off of  clonidine as well due to the fatigue. BMP after starting HCTZ was stable  Follow up visit today done via telephone. Patient states she has successfully titrated off of carvedilol. Last dose was Friday. She denies dizziness, lightheadedness, headache, blurred vision, SOB or swelling. States her HR has been in the 80's and BP around 110-120/70-80. Patient admits she feels the best she has in a long time. Is wanting to precede with tapering of clonidine.   Current HTN meds: clonidine 0.1mg  BID (8:30am/8pm), irbesartan 300mg  daily (8AM), HCTZ 12.5mg  daily  Previously tried:  - Chlorthalidone: increased BP with severe dizziness at rest - Losartan: dry cough - Nifedipine: sudden, severe headache, black/fuzzy vision, increased BP (during pregnancy) - Spironolactone: increased BP - Hydralazine: SOB, LEE - Labetalol: hallucinatory nightmares - Clonidine 0.1mg  daily: fatigue  BP goal: < 130/80 mmHg  Family History: The patient's family history includes Heart attack in her father; Hypertension in her father and mother.  Social History: Never smoker, does not drink alcohol  Diet: Does not add salt to food; enjoys eating vegetables, spinach, lean meats, salmon, chicken; drinks 1 caramel iced coffee from [employer] daily  Exercise: limited post-COVID infection; used to walk 3-5 miles on the treadmill daily before COVID infection.  Home BP readings: consistently 110's/70-80- highest 120/80  Wt Readings from Last 3 Encounters:  12/21/19 167 lb (75.8 kg)  10/27/19 160 lb 15 oz (73 kg)  10/25/19 161  lb (73 kg)   BP Readings from Last 3 Encounters:  02/02/20 102/70  01/02/20 (!) 184/110  12/21/19 118/78   Pulse Readings from Last 3 Encounters:  01/02/20 75  12/21/19 79  10/28/19 87    Renal function: CrCl cannot be calculated (Unknown ideal weight.).  Past Medical History:  Diagnosis Date  . Asthma   . Hypertension   . Thyroid disease     Current Outpatient Medications on File Prior to  Visit  Medication Sig Dispense Refill  . acetaminophen (TYLENOL) 500 MG tablet Take 1 tablet by mouth as needed.    Marland Kitchen albuterol (VENTOLIN HFA) 108 (90 Base) MCG/ACT inhaler Inhale 2 puffs into the lungs every 4 (four) hours as needed.    . carvedilol (COREG) 6.25 MG tablet Take one tablet by mouth twice a day for 5-7 days then take 1/2 tablet by mouth twice a day for 5-7 days then stop. 21 tablet 0  . cetirizine (ZYRTEC) 10 MG tablet Take 10 mg by mouth daily.     . Cholecalciferol (VITAMIN D3) 1.25 MG (50000 UT) TABS Take 1 tablet by mouth once a week.    . cloNIDine (CATAPRES) 0.1 MG tablet Take 0.1 mg by mouth 2 (two) times daily.    . fluticasone (FLONASE) 50 MCG/ACT nasal spray SPRAY 1 SPRAY INTO EACH NOSTRIL EVERY DAY    . fluticasone (FLOVENT HFA) 110 MCG/ACT inhaler Inhale 2 puffs into the lungs 2 (two) times daily. 1 Inhaler 0  . hydrochlorothiazide (MICROZIDE) 12.5 MG capsule Take 1 capsule (12.5 mg total) by mouth daily. 30 capsule 11  . irbesartan (AVAPRO) 300 MG tablet Take 1 tablet (300 mg total) by mouth daily. 30 tablet 11  . norethindrone (AYGESTIN) 5 MG tablet Take 5 mg by mouth at bedtime.    . [DISCONTINUED] spironolactone (ALDACTONE) 25 MG tablet Take 1 tablet (25 mg total) by mouth daily. 30 tablet 2   No current facility-administered medications on file prior to visit.    Allergies  Allergen Reactions  . Acetaminophen Hypertension, Other (See Comments) and Rash    Tylenol arthritis puts into CHF   . Clindamycin/Lincomycin Diarrhea  . Chlorthalidone Other (See Comments)    HTN HTN HTN   . Nifedipine Other (See Comments) and Rash    Other reaction(s): Unknown Other reaction(s): Other (See Comments) High BP High BP High BP Other reaction(s): Unknown Other reaction(s): Other (See Comments) High BP High BP High BP Other reaction(s): Unknown Other reaction(s): Other (See Comments) High BP Other reaction(s): Unknown Other reaction(s): Other (See Comments)  High BP High BP   . Spironolactone Hypertension     Assessment/Plan:  1. Hypertension - Blood pressure is at goal of <130/80 today. It has consistently been at goal at home for several days. It has been a few days since she stopped her carvedilol, therefore will start clonidine taper. Patient instructed to start taking clonidine 0.05 (1/2 tablet) twice a day for 3 days, then 0.05mg  at bedtime for 3 days, then stop. She was instructed to increased HCTZ to 25mg  if/when blood pressure starts to trend up. Patient to call me or send me a mychart message to let me know how taper is going and when she started higher dose of HCTZ. Will get BMP 1 week after start of HCTZ.  Thanks,   Ramond Dial, Pharm.D, BCPS, CPP [facility]  [id number], [place], [place] [address]  Phone: [phone]; Fax: [phone]

## 2020-02-21 DIAGNOSIS — M5431 Sciatica, right side: Secondary | ICD-10-CM | POA: Diagnosis not present

## 2020-02-21 DIAGNOSIS — G894 Chronic pain syndrome: Secondary | ICD-10-CM | POA: Diagnosis not present

## 2020-02-21 DIAGNOSIS — M6281 Muscle weakness (generalized): Secondary | ICD-10-CM | POA: Diagnosis not present

## 2020-02-21 DIAGNOSIS — M545 Low back pain: Secondary | ICD-10-CM | POA: Diagnosis not present

## 2020-02-21 DIAGNOSIS — M2559 Pain in other specified joint: Secondary | ICD-10-CM | POA: Diagnosis not present

## 2020-02-21 DIAGNOSIS — M542 Cervicalgia: Secondary | ICD-10-CM | POA: Diagnosis not present

## 2020-02-23 ENCOUNTER — Telehealth: Payer: Self-pay | Admitting: Pharmacist

## 2020-02-23 DIAGNOSIS — M5431 Sciatica, right side: Secondary | ICD-10-CM | POA: Diagnosis not present

## 2020-02-23 DIAGNOSIS — I10 Essential (primary) hypertension: Secondary | ICD-10-CM

## 2020-02-23 DIAGNOSIS — M545 Low back pain: Secondary | ICD-10-CM | POA: Diagnosis not present

## 2020-02-23 DIAGNOSIS — M6281 Muscle weakness (generalized): Secondary | ICD-10-CM | POA: Diagnosis not present

## 2020-02-23 DIAGNOSIS — M2559 Pain in other specified joint: Secondary | ICD-10-CM | POA: Diagnosis not present

## 2020-02-23 DIAGNOSIS — G894 Chronic pain syndrome: Secondary | ICD-10-CM | POA: Diagnosis not present

## 2020-02-23 DIAGNOSIS — M542 Cervicalgia: Secondary | ICD-10-CM | POA: Diagnosis not present

## 2020-02-23 NOTE — Telephone Encounter (Signed)
Patient called stating that she started getting tachycardic and palpitations on Monday. She restarted her carvedilol at 6.25mg  and has since increased to 12.5mg  BID. Her HR is much better, but still having some palpitations. Only a few today, She started her clonidine taper Monday and blood pressure is doing well. Wants to know if we should check BMP and TSH (has Hashimoto and hasn't had checked in awhile- thinks she sees endo next week).  Spoke with patient and advised that its not imperative, but not a bad idea to check. Will order TSH, T4, BMP and Mag. Continue with clonidine taper. She is to call me if she needs to increase HCTZ or palpitations do not improve.

## 2020-02-24 ENCOUNTER — Telehealth: Payer: Self-pay

## 2020-02-24 DIAGNOSIS — I10 Essential (primary) hypertension: Secondary | ICD-10-CM | POA: Diagnosis not present

## 2020-02-24 LAB — BASIC METABOLIC PANEL
BUN/Creatinine Ratio: 16 (ref 9–23)
BUN: 16 mg/dL (ref 6–24)
CO2: 26 mmol/L (ref 20–29)
Calcium: 9.5 mg/dL (ref 8.7–10.2)
Chloride: 103 mmol/L (ref 96–106)
Creatinine, Ser: 0.97 mg/dL (ref 0.57–1.00)
GFR calc Af Amer: 79 mL/min/{1.73_m2} (ref >59)
GFR calc non Af Amer: 69 mL/min/{1.73_m2} (ref >59)
Glucose: 105 mg/dL — ABNORMAL HIGH (ref 65–99)
Potassium: 4 mmol/L (ref 3.5–5.2)
Sodium: 138 mmol/L (ref 134–144)

## 2020-02-24 LAB — MAGNESIUM: Magnesium: 2 mg/dL (ref 1.6–2.3)

## 2020-02-24 LAB — T4, FREE: Free T4: 1.15 ng/dL (ref 0.82–1.77)

## 2020-02-24 LAB — TSH: TSH: 1.05 u[IU]/mL (ref 0.450–4.500)

## 2020-02-24 NOTE — Telephone Encounter (Signed)
-----   Message from Berniece Salines, DO sent at 02/24/2020 12:34 PM EDT ----- Labs normal.

## 2020-02-24 NOTE — Telephone Encounter (Signed)
Spoke with patient regarding results.  Patient verbalizes understanding and is agreeable to plan of care. Advised patient to call back with any issues or concerns.  

## 2020-02-24 NOTE — Addendum Note (Signed)
Addended by: Marcelle Overlie D on: 02/24/2020 07:30 AM   Modules accepted: Orders

## 2020-02-27 ENCOUNTER — Telehealth: Payer: Self-pay

## 2020-02-27 NOTE — Telephone Encounter (Signed)
-----   Message from Berniece Salines, DO sent at 02/24/2020  6:31 PM EDT ----- Labs normal.

## 2020-02-27 NOTE — Telephone Encounter (Signed)
Spoke with patient regarding results.  Patient verbalizes understanding and is agreeable to plan of care. Advised patient to call back with any issues or concerns.  

## 2020-03-01 DIAGNOSIS — M4722 Other spondylosis with radiculopathy, cervical region: Secondary | ICD-10-CM | POA: Diagnosis not present

## 2020-03-01 DIAGNOSIS — M4726 Other spondylosis with radiculopathy, lumbar region: Secondary | ICD-10-CM | POA: Diagnosis not present

## 2020-03-01 DIAGNOSIS — M5431 Sciatica, right side: Secondary | ICD-10-CM | POA: Diagnosis not present

## 2020-03-02 DIAGNOSIS — M545 Low back pain: Secondary | ICD-10-CM | POA: Diagnosis not present

## 2020-03-02 DIAGNOSIS — M5431 Sciatica, right side: Secondary | ICD-10-CM | POA: Diagnosis not present

## 2020-03-02 DIAGNOSIS — M542 Cervicalgia: Secondary | ICD-10-CM | POA: Diagnosis not present

## 2020-03-02 DIAGNOSIS — M6281 Muscle weakness (generalized): Secondary | ICD-10-CM | POA: Diagnosis not present

## 2020-03-02 DIAGNOSIS — G894 Chronic pain syndrome: Secondary | ICD-10-CM | POA: Diagnosis not present

## 2020-03-02 DIAGNOSIS — M2559 Pain in other specified joint: Secondary | ICD-10-CM | POA: Diagnosis not present

## 2020-03-05 DIAGNOSIS — M5431 Sciatica, right side: Secondary | ICD-10-CM | POA: Diagnosis not present

## 2020-03-05 DIAGNOSIS — M6281 Muscle weakness (generalized): Secondary | ICD-10-CM | POA: Diagnosis not present

## 2020-03-05 DIAGNOSIS — M545 Low back pain: Secondary | ICD-10-CM | POA: Diagnosis not present

## 2020-03-05 DIAGNOSIS — G894 Chronic pain syndrome: Secondary | ICD-10-CM | POA: Diagnosis not present

## 2020-03-05 DIAGNOSIS — M2559 Pain in other specified joint: Secondary | ICD-10-CM | POA: Diagnosis not present

## 2020-03-05 DIAGNOSIS — M542 Cervicalgia: Secondary | ICD-10-CM | POA: Diagnosis not present

## 2020-03-07 ENCOUNTER — Telehealth: Payer: Self-pay | Admitting: Pharmacist

## 2020-03-07 ENCOUNTER — Other Ambulatory Visit: Payer: Self-pay | Admitting: Cardiology

## 2020-03-07 DIAGNOSIS — I10 Essential (primary) hypertension: Secondary | ICD-10-CM

## 2020-03-07 MED ORDER — TELMISARTAN 80 MG PO TABS: 80.0000 mg | ORAL_TABLET | Freq: Every day | ORAL | 3 refills | Status: DC

## 2020-03-07 MED FILL — TELMISARTAN 80 MG TABLET: 80 | 90 days supply | Qty: 90 | Fill #0

## 2020-03-07 NOTE — Telephone Encounter (Signed)
Patient needs BMP for increased HCTZ dose Wishes to change to telmisartan

## 2020-03-08 ENCOUNTER — Other Ambulatory Visit: Payer: Self-pay | Admitting: Cardiology

## 2020-03-08 DIAGNOSIS — I10 Essential (primary) hypertension: Secondary | ICD-10-CM | POA: Diagnosis not present

## 2020-03-08 MED ORDER — HYDROCHLOROTHIAZIDE 12.5 MG PO CAPS: 12.5000 mg | ORAL_CAPSULE | Freq: Two times a day (BID) | ORAL | 3 refills | Status: DC

## 2020-03-08 MED FILL — HYDROCHLOROTHIAZIDE 12.5 MG: 12.5 | 90 days supply | Qty: 180 | Fill #0

## 2020-03-09 ENCOUNTER — Telehealth: Payer: Self-pay

## 2020-03-09 LAB — BASIC METABOLIC PANEL
BUN/Creatinine Ratio: 15 (ref 9–23)
BUN: 15 mg/dL (ref 6–24)
CO2: 21 mmol/L (ref 20–29)
Calcium: 9.8 mg/dL (ref 8.7–10.2)
Chloride: 102 mmol/L (ref 96–106)
Creatinine, Ser: 1.02 mg/dL — ABNORMAL HIGH (ref 0.57–1.00)
GFR calc Af Amer: 75 mL/min/{1.73_m2} (ref >59)
GFR calc non Af Amer: 65 mL/min/{1.73_m2} (ref >59)
Glucose: 92 mg/dL (ref 65–99)
Potassium: 4.2 mmol/L (ref 3.5–5.2)
Sodium: 137 mmol/L (ref 134–144)

## 2020-03-09 NOTE — Telephone Encounter (Signed)
Spoke with patient regarding results.  Patient verbalizes understanding and is agreeable to plan of care. Advised patient to call back with any issues or concerns.  

## 2020-03-09 NOTE — Telephone Encounter (Signed)
-----   Message from Richardo Priest, MD sent at 03/09/2020  7:55 AM EDT ----- Normal or stable result

## 2020-03-18 NOTE — Progress Notes (Deleted)
Cardiology Office Note:    Date:  03/18/2020   ID:  Lindsay Burke, DOB [birth year], MRN [id number]  PCP:  Forrest Moron, MD  Cardiologist:  Shirlee More, MD    Referring MD: Forrest Moron, MD    ASSESSMENT:    No diagnosis found. PLAN:    In order of problems listed above:  1. ***   Next appointment: ***   Medication Adjustments/Labs and Tests Ordered: Current medicines are reviewed at length with the patient today.  Concerns regarding medicines are outlined above.  No orders of the defined types were placed in this encounter.  No orders of the defined types were placed in this encounter.   No chief complaint on file.   History of Present Illness:    Lindsay Burke is a 49 y.o. female with a hx of hypertension and MVP last seen 12/21/2019. Compliance with diet, lifestyle and medications: ***  Echocardiogram 12/27/2019 showed normal mitral valve structure and trivial regurgitation felt to be physiologic.  Left ventricular function was normal.  With the assistance of the hypertension clinic and pharmacy she was titrated off carvedilol because of CNS side effects with plans to taper and discontinue clonidine.  She has had multiple drug intolerances including CNS side effects of labetalol carvedilol paradoxical response to spironolactone with increased blood pressure hydralazine with edema nifedipine with headache losartan with a dry cough and chlorthalidone with dizziness. Past Medical History:  Diagnosis Date   Asthma    Hypertension    Thyroid disease     Past Surgical History:  Procedure Laterality Date   CHOLECYSTECTOMY      Current Medications: Current Meds  Medication Sig   ergocalciferol (VITAMIN D2) 1.25 MG (50000 UT) capsule TAKE 1 CAPSULE BY MOUTH ONCE A WEEK     Allergies:   Acetaminophen, Clindamycin/lincomycin, Chlorthalidone, Nifedipine, and Spironolactone   Social History   Socioeconomic History   Marital status: Married    Spouse  name: Not on file   Number of children: Not on file   Years of education: Not on file   Highest education level: Not on file  Occupational History   Not on file  Tobacco Use   Smoking status: Never Smoker   Smokeless tobacco: Never Used  Substance and Sexual Activity   Alcohol use: Never   Drug use: Never   Sexual activity: Not on file    Comment: progestrone  Other Topics Concern   Not on file  Social History Narrative   Not on file   Social Determinants of Health   Financial Resource Strain:    Difficulty of Paying Living Expenses:   Food Insecurity:    Worried About Charity fundraiser in the Last Year:    Arboriculturist in the Last Year:   Transportation Needs:    Film/video editor (Medical):    Lack of Transportation (Non-Medical):   Physical Activity:    Days of Exercise per Week:    Minutes of Exercise per Session:   Stress:    Feeling of Stress :   Social Connections:    Frequency of Communication with Friends and Family:    Frequency of Social Gatherings with Friends and Family:    Attends Religious Services:    Active Member of Clubs or Organizations:    Attends Music therapist:    Marital Status:      Family History: The patient's ***family history includes Heart attack in her father; Hypertension in her father  and mother. ROS:   Please see the history of present illness.    All other systems reviewed and are negative.  EKGs/Labs/Other Studies Reviewed:    The following studies were reviewed today:  EKG:  EKG ordered today and personally reviewed.  The ekg ordered today demonstrates ***  Recent Labs: 10/27/2019: ALT 121; Hemoglobin 12.6; Platelets 139 12/27/2019: NT-Pro BNP 178 02/23/2020: Magnesium 2.0; TSH 1.050 03/08/2020: BUN 15; Creatinine, Ser 1.02; Potassium 4.2; Sodium 137  Recent Lipid Panel No results found for: CHOL, TRIG, HDL, CHOLHDL, VLDL, LDLCALC, LDLDIRECT  Physical Exam:    VS:  There  were no vitals taken for this visit.    Wt Readings from Last 3 Encounters:  12/21/19 167 lb (75.8 kg)  10/27/19 160 lb 15 oz (73 kg)  10/25/19 161 lb (73 kg)     GEN: *** Well nourished, well developed in no acute distress HEENT: Normal NECK: No JVD; No carotid bruits LYMPHATICS: No lymphadenopathy CARDIAC: ***RRR, no murmurs, rubs, gallops RESPIRATORY:  Clear to auscultation without rales, wheezing or rhonchi  ABDOMEN: Soft, non-tender, non-distended MUSCULOSKELETAL:  No edema; No deformity  SKIN: Warm and dry NEUROLOGIC:  Alert and oriented x 3 PSYCHIATRIC:  Normal affect    Signed, Shirlee More, MD  03/18/2020 5:54 PM    [facility]

## 2020-03-19 ENCOUNTER — Ambulatory Visit: Payer: 59 | Admitting: Cardiology

## 2020-03-27 MED FILL — NORETHINDRONE 5 MG TABLET: 5 | 90 days supply | Qty: 90 | Fill #1

## 2020-03-29 ENCOUNTER — Other Ambulatory Visit: Payer: Self-pay | Admitting: Pharmacist

## 2020-03-29 DIAGNOSIS — I10 Essential (primary) hypertension: Secondary | ICD-10-CM

## 2020-03-29 NOTE — Progress Notes (Signed)
Bet

## 2020-03-30 DIAGNOSIS — I10 Essential (primary) hypertension: Secondary | ICD-10-CM | POA: Diagnosis not present

## 2020-03-30 DIAGNOSIS — E663 Overweight: Secondary | ICD-10-CM | POA: Diagnosis not present

## 2020-03-30 LAB — BASIC METABOLIC PANEL
BUN/Creatinine Ratio: 14 (ref 9–23)
BUN: 14 mg/dL (ref 6–24)
CO2: 19 mmol/L — ABNORMAL LOW (ref 20–29)
Calcium: 9.3 mg/dL (ref 8.7–10.2)
Chloride: 103 mmol/L (ref 96–106)
Creatinine, Ser: 0.97 mg/dL (ref 0.57–1.00)
GFR calc Af Amer: 79 mL/min/{1.73_m2} (ref >59)
GFR calc non Af Amer: 69 mL/min/{1.73_m2} (ref >59)
Glucose: 85 mg/dL (ref 65–99)
Potassium: 4 mmol/L (ref 3.5–5.2)
Sodium: 140 mmol/L (ref 134–144)

## 2020-03-30 MED FILL — METFORMIN HCL 500 MG TABS: 500 | 30 days supply | Qty: 60 | Fill #0

## 2020-04-02 ENCOUNTER — Telehealth: Payer: Self-pay

## 2020-04-02 NOTE — Telephone Encounter (Signed)
Spoke with patient regarding results and recommendation.  Patient verbalizes understanding and is agreeable to plan of care. Advised patient to call back with any issues or concerns.  

## 2020-04-02 NOTE — Telephone Encounter (Signed)
-----   Message from Richardo Priest, MD sent at 03/30/2020  1:53 PM EDT ----- Normal or stable result  No changes

## 2020-04-12 MED FILL — CARVEDILOL 25 MG TABLET: 25 | 90 days supply | Qty: 180 | Fill #1

## 2020-04-27 ENCOUNTER — Ambulatory Visit: Payer: 59 | Admitting: Cardiology

## 2020-05-15 ENCOUNTER — Other Ambulatory Visit (HOSPITAL_BASED_OUTPATIENT_CLINIC_OR_DEPARTMENT_OTHER): Payer: Self-pay | Admitting: Obstetrics and Gynecology

## 2020-05-15 DIAGNOSIS — N930 Postcoital and contact bleeding: Secondary | ICD-10-CM | POA: Diagnosis not present

## 2020-05-15 DIAGNOSIS — N888 Other specified noninflammatory disorders of cervix uteri: Secondary | ICD-10-CM | POA: Diagnosis not present

## 2020-05-15 DIAGNOSIS — N938 Other specified abnormal uterine and vaginal bleeding: Secondary | ICD-10-CM | POA: Diagnosis not present

## 2020-05-15 DIAGNOSIS — Z8616 Personal history of COVID-19: Secondary | ICD-10-CM | POA: Diagnosis not present

## 2020-05-15 DIAGNOSIS — Z124 Encounter for screening for malignant neoplasm of cervix: Secondary | ICD-10-CM | POA: Diagnosis not present

## 2020-05-15 DIAGNOSIS — N858 Other specified noninflammatory disorders of uterus: Secondary | ICD-10-CM | POA: Diagnosis not present

## 2020-05-15 DIAGNOSIS — N921 Excessive and frequent menstruation with irregular cycle: Secondary | ICD-10-CM | POA: Diagnosis not present

## 2020-05-15 DIAGNOSIS — Z01419 Encounter for gynecological examination (general) (routine) without abnormal findings: Secondary | ICD-10-CM | POA: Diagnosis not present

## 2020-05-15 DIAGNOSIS — Z9889 Other specified postprocedural states: Secondary | ICD-10-CM | POA: Diagnosis not present

## 2020-05-15 DIAGNOSIS — M5126 Other intervertebral disc displacement, lumbar region: Secondary | ICD-10-CM | POA: Diagnosis not present

## 2020-06-04 MED FILL — TELMISARTAN 80 MG TABLET: 80 | 90 days supply | Qty: 90 | Fill #1

## 2020-06-27 MED FILL — NORETHINDRONE 5 MG TABLET: 5 | 90 days supply | Qty: 90 | Fill #2

## 2020-07-10 MED FILL — CARVEDILOL 25 MG TABLET: 25 | 90 days supply | Qty: 180 | Fill #2

## 2020-07-11 ENCOUNTER — Ambulatory Visit: Payer: 59 | Admitting: Cardiology

## 2020-07-17 DIAGNOSIS — Z23 Encounter for immunization: Secondary | ICD-10-CM | POA: Diagnosis not present

## 2020-07-17 DIAGNOSIS — E559 Vitamin D deficiency, unspecified: Secondary | ICD-10-CM | POA: Diagnosis not present

## 2020-07-17 DIAGNOSIS — E042 Nontoxic multinodular goiter: Secondary | ICD-10-CM | POA: Diagnosis not present

## 2020-07-17 DIAGNOSIS — Z1152 Encounter for screening for COVID-19: Secondary | ICD-10-CM | POA: Diagnosis not present

## 2020-07-17 DIAGNOSIS — E039 Hypothyroidism, unspecified: Secondary | ICD-10-CM | POA: Diagnosis not present

## 2020-08-17 DIAGNOSIS — Z23 Encounter for immunization: Secondary | ICD-10-CM | POA: Diagnosis not present

## 2020-08-27 MED FILL — HYDROCHLOROTHIAZIDE 12.5 MG: 12.5 | 90 days supply | Qty: 180 | Fill #1

## 2020-08-27 NOTE — Progress Notes (Signed)
Cardiology Office Note:    Date:  08/28/2020   ID:  [name], DOB [birth year], MRN [id number]  PCP:  Forrest Moron, MD  Cardiologist:  Shirlee More, MD    Referring MD: Forrest Moron, MD    ASSESSMENT:    1. Palpitations   2. Essential hypertension   3. Mitral valve prolapse   4. Dyspnea due to COVID-19    PLAN:    In order of problems listed above:  1. 3-day ZIO monitor check electrolytes magnesium continue current beta-blocker 2. Previously resistant she has had an excellent response to multidrug regimen including beta blocker diuretic and ARB 3. Stable no valvular regurgitation 4. Resolved   Next appointment: 6 months   Medication Adjustments/Labs and Tests Ordered: Current medicines are reviewed at length with the patient today.  Concerns regarding medicines are outlined above.  Orders Placed This Encounter  Procedures  . Comprehensive metabolic panel  . Lipid panel  . LONG TERM MONITOR (3-14 DAYS)  . EKG 12-Lead   No orders of the defined types were placed in this encounter.   Chief Complaint  Patient presents with  . Follow-up  . Shortness of Breath  . Mitral Valve Prolapse    History of Present Illness:    Lindsay Burke is a 49 y.o. female with a hx of hypertension mitral valve prolapse asthma and COVID-19 infection.  Her coronary calcium score is zero.  She was last seen 12/21/2019 and transition from labetalol to carvedilol to decrease CNS side effects.  Compliance with diet, lifestyle and medications: Yes  Overall doing well slowly steadily improved from her COVID-19 shortness of breath resolved strength back to normal however she is having daily palpitations skipping beats and is concerned.  Home blood pressure constantly runs in range tolerates her medications well.  Regular recheck her electrolytes magnesium today and a 3-day ZIO monitor to assess for arrhythmia.  She is reassured to know that her heart is normal following severe COVID-19  illness and she has had a single dose of mRNA vaccine  Echocardiogram after that visit 12/27/2019 showed normal left ventricular size systolic and diastolic function the right ventricle is also normal and there was no significant valvular abnormality. Past Medical History:  Diagnosis Date  . Asthma   . Hypertension   . Thyroid disease     Past Surgical History:  Procedure Laterality Date  . CHOLECYSTECTOMY      Current Medications: Current Meds  Medication Sig  . acetaminophen (TYLENOL) 500 MG tablet Take 1 tablet by mouth as needed.  Marland Kitchen albuterol (VENTOLIN HFA) 108 (90 Base) MCG/ACT inhaler Inhale 2 puffs into the lungs every 4 (four) hours as needed.  . carvedilol (COREG) 25 MG tablet Take 25 mg by mouth in the morning and at bedtime.  . cetirizine (ZYRTEC) 10 MG tablet Take 10 mg by mouth daily. 0.5 TABLET AT BEDTIME  . Cholecalciferol (VITAMIN D3) 125 MCG (5000 UT) TABS Take 1 tablet by mouth daily.   . fluticasone (FLONASE) 50 MCG/ACT nasal spray SPRAY 1 SPRAY INTO EACH NOSTRIL EVERY DAY  . fluticasone (FLOVENT HFA) 110 MCG/ACT inhaler Inhale 2 puffs into the lungs 2 (two) times daily.  . hydrochlorothiazide (MICROZIDE) 12.5 MG capsule Take 1 capsule (12.5 mg total) by mouth 2 (two) times daily. (Patient taking differently: Take 12.5 mg by mouth daily. )  . norethindrone (AYGESTIN) 5 MG tablet Take 5 mg by mouth at bedtime.  Marland Kitchen telmisartan (MICARDIS) 80 MG tablet Take 1 tablet (80  mg total) by mouth daily.  . [DISCONTINUED] ergocalciferol (VITAMIN D2) 1.25 MG (50000 UT) capsule TAKE 1 CAPSULE BY MOUTH ONCE A WEEK     Allergies:   Acetaminophen, Clindamycin/lincomycin, Chlorthalidone, Nifedipine, Clonidine, Hydralazine, and Spironolactone   Social History   Socioeconomic History  . Marital status: Married    Spouse name: Not on file  . Number of children: Not on file  . Years of education: Not on file  . Highest education level: Not on file  Occupational History  . Not on  file  Tobacco Use  . Smoking status: Never Smoker  . Smokeless tobacco: Never Used  Vaping Use  . Vaping Use: Never used  Substance and Sexual Activity  . Alcohol use: Never  . Drug use: Never  . Sexual activity: Not on file    Comment: progestrone  Other Topics Concern  . Not on file  Social History Narrative  . Not on file   Social Determinants of Health   Financial Resource Strain:   . Difficulty of Paying Living Expenses: Not on file  Food Insecurity:   . Worried About Charity fundraiser in the Last Year: Not on file  . Ran Out of Food in the Last Year: Not on file  Transportation Needs:   . Lack of Transportation (Medical): Not on file  . Lack of Transportation (Non-Medical): Not on file  Physical Activity:   . Days of Exercise per Week: Not on file  . Minutes of Exercise per Session: Not on file  Stress:   . Feeling of Stress : Not on file  Social Connections:   . Frequency of Communication with Friends and Family: Not on file  . Frequency of Social Gatherings with Friends and Family: Not on file  . Attends Religious Services: Not on file  . Active Member of Clubs or Organizations: Not on file  . Attends Archivist Meetings: Not on file  . Marital Status: Not on file     Family History: The patient's family history includes Heart attack in her father; Hypertension in her father and mother. ROS:   Please see the history of present illness.    All other systems reviewed and are negative.  EKGs/Labs/Other Studies Reviewed:    The following studies were reviewed today:  EKG:  EKG ordered today and personally reviewed.  The ekg ordered today demonstrates sinus rhythm physiologic sinus arrhythmia otherwise normal  Recent Labs: 10/27/2019: ALT 121; Hemoglobin 12.6; Platelets 139 12/27/2019: NT-Pro BNP 178 02/23/2020: Magnesium 2.0; TSH 1.050 03/29/2020: BUN 14; Creatinine, Ser 0.97; Potassium 4.0; Sodium 140  Recent Lipid Panel No results found for:  CHOL, TRIG, HDL, CHOLHDL, VLDL, LDLCALC, LDLDIRECT  Physical Exam:    VS:  BP 138/88   Pulse 68   Ht 5\' 4"  (1.626 m)   Wt 162 lb (73.5 kg)   SpO2 98%   BMI 27.81 kg/m     Wt Readings from Last 3 Encounters:  08/28/20 162 lb (73.5 kg)  12/21/19 167 lb (75.8 kg)  10/27/19 160 lb 15 oz (73 kg)     GEN:  Well nourished, well developed in no acute distress HEENT: Normal NECK: No JVD; No carotid bruits LYMPHATICS: No lymphadenopathy CARDIAC: RRR, no murmurs, rubs, gallops RESPIRATORY:  Clear to auscultation without rales, wheezing or rhonchi  ABDOMEN: Soft, non-tender, non-distended MUSCULOSKELETAL:  No edema; No deformity  SKIN: Warm and dry NEUROLOGIC:  Alert and oriented x 3 PSYCHIATRIC:  Normal affect    Signed,  Shirlee More, MD  08/28/2020 9:43 AM    [facility]

## 2020-08-28 ENCOUNTER — Ambulatory Visit: Payer: 59 | Admitting: Cardiology

## 2020-08-28 ENCOUNTER — Other Ambulatory Visit (HOSPITAL_BASED_OUTPATIENT_CLINIC_OR_DEPARTMENT_OTHER): Payer: Self-pay | Admitting: Internal Medicine

## 2020-08-28 ENCOUNTER — Encounter: Payer: Self-pay | Admitting: Cardiology

## 2020-08-28 ENCOUNTER — Ambulatory Visit (INDEPENDENT_AMBULATORY_CARE_PROVIDER_SITE_OTHER): Payer: 59

## 2020-08-28 ENCOUNTER — Other Ambulatory Visit: Payer: Self-pay

## 2020-08-28 VITALS — BP 138/88 | HR 68 | Ht 64.0 in | Wt 162.0 lb

## 2020-08-28 DIAGNOSIS — R002 Palpitations: Secondary | ICD-10-CM

## 2020-08-28 DIAGNOSIS — I341 Nonrheumatic mitral (valve) prolapse: Secondary | ICD-10-CM | POA: Diagnosis not present

## 2020-08-28 DIAGNOSIS — R0600 Dyspnea, unspecified: Secondary | ICD-10-CM | POA: Diagnosis not present

## 2020-08-28 DIAGNOSIS — U071 COVID-19: Secondary | ICD-10-CM | POA: Diagnosis not present

## 2020-08-28 DIAGNOSIS — I10 Essential (primary) hypertension: Secondary | ICD-10-CM

## 2020-08-28 HISTORY — DX: Palpitations: R00.2

## 2020-08-28 NOTE — Patient Instructions (Signed)
Medication Instructions:  Your physician recommends that you continue on your current medications as directed. Please refer to the Current Medication list given to you today.  *If you need a refill on your cardiac medications before your next appointment, please call your pharmacy*   Lab Work: Your physician recommends that you return for lab work in: [place], Lipids If you have labs (blood work) drawn today and your tests are completely normal, you will receive your results only by: Marland Kitchen MyChart Message (if you have MyChart) OR . A paper copy in the mail If you have any lab test that is abnormal or we need to change your treatment, we will call you to review the results.   Testing/Procedures: A zio monitor was ordered today. It will remain on for 3 days. You will then return monitor and event diary in provided box. It takes 1-2 weeks for report to be downloaded and returned to [place]. We will call you with the results. If monitor falls off or has orange flashing light, please call Zio for further instructions.      Follow-Up: At [facility], you and your health needs are our priority.  As part of our continuing mission to provide you with exceptional heart care, we have created designated Provider Care Teams.  These Care Teams include your primary Cardiologist (physician) and Advanced Practice Providers (APPs -  Physician Assistants and Nurse Practitioners) who all work together to provide you with the care you need, when you need it.  We recommend signing up for the patient portal called "MyChart".  Sign up information is provided on this After Visit Summary.  MyChart is used to connect with patients for Virtual Visits (Telemedicine).  Patients are able to view lab/test results, encounter notes, upcoming appointments, etc.  Non-urgent messages can be sent to your provider as well.   To learn more about what you can do with MyChart, go to [email or web address].    Your next appointment:    6 month(s)  The format for your next appointment:   In Person  Provider:   Shirlee More, MD   Other Instructions

## 2020-08-29 LAB — LIPID PANEL
Chol/HDL Ratio: 5.8 ratio — ABNORMAL HIGH (ref 0.0–4.4)
Cholesterol, Total: 202 mg/dL — ABNORMAL HIGH (ref 100–199)
HDL: 35 mg/dL — ABNORMAL LOW (ref >39)
LDL Chol Calc (NIH): 146 mg/dL — ABNORMAL HIGH (ref 0–99)
Triglycerides: 115 mg/dL (ref 0–149)
VLDL Cholesterol Cal: 21 mg/dL (ref 5–40)

## 2020-08-29 LAB — COMPREHENSIVE METABOLIC PANEL
ALT: 24 IU/L (ref 0–32)
AST: 13 IU/L (ref 0–40)
Albumin/Globulin Ratio: 2 (ref 1.2–2.2)
Albumin: 4.7 g/dL (ref 3.8–4.8)
Alkaline Phosphatase: 67 IU/L (ref 44–121)
BUN/Creatinine Ratio: 17 (ref 9–23)
BUN: 13 mg/dL (ref 6–24)
Bilirubin Total: 0.3 mg/dL (ref 0.0–1.2)
CO2: 20 mmol/L (ref 20–29)
Calcium: 9.3 mg/dL (ref 8.7–10.2)
Chloride: 106 mmol/L (ref 96–106)
Creatinine, Ser: 0.78 mg/dL (ref 0.57–1.00)
GFR calc Af Amer: 103 mL/min/{1.73_m2} (ref >59)
GFR calc non Af Amer: 90 mL/min/{1.73_m2} (ref >59)
Globulin, Total: 2.3 g/dL (ref 1.5–4.5)
Glucose: 81 mg/dL (ref 65–99)
Potassium: 4.4 mmol/L (ref 3.5–5.2)
Sodium: 141 mmol/L (ref 134–144)
Total Protein: 7 g/dL (ref 6.0–8.5)

## 2020-08-30 ENCOUNTER — Telehealth: Payer: Self-pay

## 2020-08-30 NOTE — Telephone Encounter (Signed)
Left message on patients voicemail to please return our call.   

## 2020-08-30 NOTE — Telephone Encounter (Signed)
-----   Message from Richardo Priest, MD sent at 08/29/2020  4:36 PM EST ----- Her LDL cholesterol is elevated With hypertension I think she should be taking a statin I would advise pravastatin with the least potential for drug interactions and muscle symptoms and place her in a low dose 20 mg daily. If agreeable check CMP lipids in 6 weeks

## 2020-08-31 ENCOUNTER — Telehealth: Payer: Self-pay

## 2020-08-31 NOTE — Telephone Encounter (Signed)
Left message on patients voicemail to please return our call.   

## 2020-08-31 NOTE — Telephone Encounter (Signed)
-----   Message from Richardo Priest, MD sent at 08/29/2020  4:36 PM EST ----- Her LDL cholesterol is elevated With hypertension I think she should be taking a statin I would advise pravastatin with the least potential for drug interactions and muscle symptoms and place her in a low dose 20 mg daily. If agreeable check CMP lipids in 6 weeks

## 2020-09-03 ENCOUNTER — Telehealth: Payer: Self-pay

## 2020-09-03 DIAGNOSIS — I10 Essential (primary) hypertension: Secondary | ICD-10-CM

## 2020-09-03 DIAGNOSIS — E785 Hyperlipidemia, unspecified: Secondary | ICD-10-CM

## 2020-09-03 MED FILL — TELMISARTAN 80 MG TABS: 80 | 90 days supply | Qty: 90 | Fill #2

## 2020-09-03 NOTE — Telephone Encounter (Signed)
Spoke to the patient just now regarding these results and recommendations. She states that at this time she will not start a statin medication or any other medications as she feels that this is due to lifestyle changes. She states that she will come in to get these labs re-drawn in 6 weeks and will decide at that point if she is willing to start a medication to help with this.    Encouraged patient to call back with any questions or concerns.

## 2020-09-03 NOTE — Telephone Encounter (Signed)
-----   Message from Richardo Priest, MD sent at 08/29/2020  4:36 PM EST ----- Her LDL cholesterol is elevated With hypertension I think she should be taking a statin I would advise pravastatin with the least potential for drug interactions and muscle symptoms and place her in a low dose 20 mg daily. If agreeable check CMP lipids in 6 weeks

## 2020-09-10 DIAGNOSIS — R002 Palpitations: Secondary | ICD-10-CM | POA: Diagnosis not present

## 2020-09-14 ENCOUNTER — Telehealth: Payer: Self-pay

## 2020-09-14 NOTE — Telephone Encounter (Signed)
Spoke with patient regarding results and recommendation.  Patient verbalizes understanding and is agreeable to plan of care. Advised patient to call back with any issues or concerns.  

## 2020-09-14 NOTE — Telephone Encounter (Signed)
-----   Message from Richardo Priest, MD sent at 09/14/2020  4:16 PM EST ----- Is a good result overall there is very Dehn arrhythmia but she is aware of extra beats.  At this time no changes

## 2020-09-25 MED FILL — NORETHINDRONE 5 MG TABLET: 5 | 90 days supply | Qty: 90 | Fill #3

## 2020-09-27 MED FILL — WEGOVY 0.25 MG/0.5ML SOAJ: 0.25 | 28 days supply | Qty: 2 | Fill #0

## 2020-10-03 MED FILL — CARVEDILOL 25 MG TABLET: 25 | 90 days supply | Qty: 180 | Fill #3

## 2020-10-17 ENCOUNTER — Ambulatory Visit: Payer: 59 | Admitting: Cardiology

## 2020-11-28 MED FILL — TELMISARTAN 80 MG TABS: 80 | 90 days supply | Qty: 90 | Fill #3

## 2020-11-30 DIAGNOSIS — E042 Nontoxic multinodular goiter: Secondary | ICD-10-CM | POA: Diagnosis not present

## 2020-12-13 DIAGNOSIS — M25561 Pain in right knee: Secondary | ICD-10-CM | POA: Diagnosis not present

## 2020-12-15 DIAGNOSIS — I878 Other specified disorders of veins: Secondary | ICD-10-CM | POA: Diagnosis not present

## 2020-12-15 DIAGNOSIS — M5136 Other intervertebral disc degeneration, lumbar region: Secondary | ICD-10-CM | POA: Diagnosis not present

## 2020-12-15 DIAGNOSIS — K3189 Other diseases of stomach and duodenum: Secondary | ICD-10-CM | POA: Diagnosis not present

## 2020-12-15 DIAGNOSIS — R109 Unspecified abdominal pain: Secondary | ICD-10-CM | POA: Diagnosis not present

## 2020-12-15 DIAGNOSIS — N200 Calculus of kidney: Secondary | ICD-10-CM | POA: Diagnosis not present

## 2020-12-15 DIAGNOSIS — E871 Hypo-osmolality and hyponatremia: Secondary | ICD-10-CM | POA: Diagnosis not present

## 2020-12-15 DIAGNOSIS — N132 Hydronephrosis with renal and ureteral calculous obstruction: Secondary | ICD-10-CM | POA: Diagnosis not present

## 2020-12-20 ENCOUNTER — Other Ambulatory Visit (HOSPITAL_BASED_OUTPATIENT_CLINIC_OR_DEPARTMENT_OTHER): Payer: Self-pay | Admitting: Family Medicine

## 2020-12-20 MED FILL — NORETHINDRONE 5 MG TABLET: 5 | 90 days supply | Qty: 90 | Fill #0

## 2020-12-26 ENCOUNTER — Other Ambulatory Visit (HOSPITAL_BASED_OUTPATIENT_CLINIC_OR_DEPARTMENT_OTHER): Payer: Self-pay | Admitting: Internal Medicine

## 2021-01-14 ENCOUNTER — Other Ambulatory Visit (HOSPITAL_BASED_OUTPATIENT_CLINIC_OR_DEPARTMENT_OTHER): Payer: Self-pay

## 2021-01-14 MED FILL — Semaglutide (Weight Mngmt) Soln Auto-Injector 1 MG/0.5ML: SUBCUTANEOUS | 28 days supply | Qty: 2 | Fill #0 | Status: AC

## 2021-01-17 ENCOUNTER — Other Ambulatory Visit (HOSPITAL_BASED_OUTPATIENT_CLINIC_OR_DEPARTMENT_OTHER): Payer: Self-pay

## 2021-01-17 DIAGNOSIS — M25561 Pain in right knee: Secondary | ICD-10-CM | POA: Diagnosis not present

## 2021-01-17 MED ORDER — DICLOFENAC SODIUM 1 % EX GEL
CUTANEOUS | 0 refills | Status: DC
  Filled 2021-01-17: qty 500, 30d supply, fill #0

## 2021-02-05 ENCOUNTER — Other Ambulatory Visit (HOSPITAL_BASED_OUTPATIENT_CLINIC_OR_DEPARTMENT_OTHER): Payer: Self-pay

## 2021-02-05 MED FILL — Semaglutide (Weight Mngmt) Soln Auto-Injector 1.7 MG/0.75ML: SUBCUTANEOUS | 28 days supply | Qty: 3 | Fill #0 | Status: AC

## 2021-02-06 ENCOUNTER — Other Ambulatory Visit (HOSPITAL_BASED_OUTPATIENT_CLINIC_OR_DEPARTMENT_OTHER): Payer: Self-pay

## 2021-02-06 MED FILL — Hydrochlorothiazide Cap 12.5 MG: ORAL | 90 days supply | Qty: 180 | Fill #0 | Status: AC

## 2021-02-06 NOTE — Progress Notes (Deleted)
Office Visit Note  Patient: Lindsay Burke             Date of Birth: January 23, 1971           MRN: [id number]             PCP: Forrest Moron, MD Referring: [place] Crane* Visit Date: 02/15/2021 Occupation: @GUAROCC @  Subjective:  No chief complaint on file.   History of Present Illness: Lindsay Burke is a 50 y.o. female ***   Activities of Daily Living:  Patient reports morning stiffness for *** {minute/hour:19697}.   Patient {ACTIONS;DENIES/REPORTS:21021675::"Denies"} nocturnal pain.  Difficulty dressing/grooming: {ACTIONS;DENIES/REPORTS:21021675::"Denies"} Difficulty climbing stairs: {ACTIONS;DENIES/REPORTS:21021675::"Denies"} Difficulty getting out of chair: {ACTIONS;DENIES/REPORTS:21021675::"Denies"} Difficulty using hands for taps, buttons, cutlery, and/or writing: {ACTIONS;DENIES/REPORTS:21021675::"Denies"}  No Rheumatology ROS completed.   PMFS History:  Patient Active Problem List   Diagnosis Date Noted  . Palpitations 08/28/2020  . Dyspnea due to COVID-19 12/21/2019  . COVID-19 10/25/2019  . Essential hypertension 09/05/2019  . Malaise and fatigue 09/05/2019  . Cardiac risk counseling 09/05/2019  . Snores 09/05/2019  . Mild intermittent asthma with acute exacerbation 07/09/2018  . Vitamin D deficiency 07/09/2018  . Fibrocystic breast changes, left 11/09/2017  . Hashimoto's disease 06/19/2017  . Abnormal uterine bleeding 03/12/2016  . History of endometrial ablation 03/12/2016  . Menorrhagia with irregular cycle 03/12/2016  . Thyroid disorder 03/12/2016  . Abnormality of left breast on screening mammogram 12/03/2015  . 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 Protrusion of intervertebral disc of lumbosacral region 02/21/2015  . Right-sided low back pain without sciatica 01/30/2015  . SI (sacroiliac) joint dysfunction 01/30/2015  . History of renal stone 03/08/2014  . H/O knee surgery 03/08/2014  . Hx of cholecystectomy 03/08/2014   . Fatigue 01/18/2014  . Multinodular goiter 01/18/2014    Past Medical History:  Diagnosis Date  . Asthma   . Hypertension   . Thyroid disease     Family History  Problem Relation Age of Onset  . Hypertension Mother   . Heart attack Father   . Hypertension Father    Past Surgical History:  Procedure Laterality Date  . CHOLECYSTECTOMY     Social History   Social History Narrative  . Not on file   Immunization History  Administered Date(s) Administered  . Influenza-Unspecified 08/09/2015     Objective: Vital Signs: There were no vitals taken for this visit.   Physical Exam   Musculoskeletal Exam: ***  CDAI Exam: CDAI Score: -- Patient Global: --; Provider Global: -- Swollen: --; Tender: -- Joint Exam 02/15/2021   No joint exam has been documented for this visit   There is currently no information documented on the homunculus. Go to the Rheumatology activity and complete the homunculus joint exam.  Investigation: No additional findings.  Imaging: No results found.  Recent Labs: Lab Results  Component Value Date   WBC 3.8 (L) 10/27/2019   HGB 12.6 10/27/2019   PLT 139 (L) 10/27/2019   NA 141 08/28/2020   K 4.4 08/28/2020   CL 106 08/28/2020   CO2 20 08/28/2020   GLUCOSE 81 08/28/2020   BUN 13 08/28/2020   CREATININE 0.78 08/28/2020   BILITOT 0.3 08/28/2020   ALKPHOS 67 08/28/2020   AST 13 08/28/2020   ALT 24 08/28/2020   PROT 7.0 08/28/2020   ALBUMIN 4.7 08/28/2020   CALCIUM 9.3 08/28/2020   GFRAA 103 08/28/2020    Speciality Comments: No specialty comments available.  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  No orders of the defined types were placed in this encounter.   Face-to-face time spent with patient was *** minutes. Greater than 50% of time was spent in counseling and coordination of care.  Follow-Up Instructions: No follow-ups on file.   Ofilia Neas, PA-C  Note - This record has been created using Dragon software.  Chart creation errors have been sought, but may not always  have been located. Such creation errors do not reflect on  the standard of medical care.

## 2021-02-08 ENCOUNTER — Other Ambulatory Visit (HOSPITAL_BASED_OUTPATIENT_CLINIC_OR_DEPARTMENT_OTHER): Payer: Self-pay

## 2021-02-08 ENCOUNTER — Telehealth: Payer: Self-pay | Admitting: Cardiology

## 2021-02-08 MED ORDER — CARVEDILOL 25 MG PO TABS
25.0000 mg | ORAL_TABLET | Freq: Two times a day (BID) | ORAL | 1 refills | Status: DC
  Filled 2021-02-08: qty 180, 90d supply, fill #0

## 2021-02-08 MED ORDER — CARVEDILOL 25 MG PO TABS
25.0000 mg | ORAL_TABLET | Freq: Two times a day (BID) | ORAL | 3 refills | Status: DC
Start: 2021-02-08 — End: 2021-05-27
  Filled 2021-02-08: qty 180, 90d supply, fill #0

## 2021-02-08 NOTE — Telephone Encounter (Signed)
*  STAT* If patient is at the pharmacy, call can be transferred to refill team.   1. Which medications need to be refilled? (please list name of each medication and dose if known) carvedilol (COREG) 25 MG tablet  2. Which pharmacy/location (including street and city if local pharmacy) is medication to be sent to? [facility]  3. Do they need a 30 day or 90 day supply? [place]

## 2021-02-15 ENCOUNTER — Ambulatory Visit: Payer: 59 | Admitting: Rheumatology

## 2021-02-15 DIAGNOSIS — E063 Autoimmune thyroiditis: Secondary | ICD-10-CM

## 2021-02-15 DIAGNOSIS — N6012 Diffuse cystic mastopathy of left breast: Secondary | ICD-10-CM

## 2021-02-15 DIAGNOSIS — R5381 Other malaise: Secondary | ICD-10-CM

## 2021-02-15 DIAGNOSIS — I10 Essential (primary) hypertension: Secondary | ICD-10-CM

## 2021-02-15 DIAGNOSIS — Z9889 Other specified postprocedural states: Secondary | ICD-10-CM

## 2021-02-15 DIAGNOSIS — E559 Vitamin D deficiency, unspecified: Secondary | ICD-10-CM

## 2021-02-15 DIAGNOSIS — M5126 Other intervertebral disc displacement, lumbar region: Secondary | ICD-10-CM

## 2021-02-15 DIAGNOSIS — M533 Sacrococcygeal disorders, not elsewhere classified: Secondary | ICD-10-CM

## 2021-02-15 DIAGNOSIS — Z87442 Personal history of urinary calculi: Secondary | ICD-10-CM

## 2021-02-15 DIAGNOSIS — M791 Myalgia, unspecified site: Secondary | ICD-10-CM

## 2021-02-15 DIAGNOSIS — Z9049 Acquired absence of other specified parts of digestive tract: Secondary | ICD-10-CM

## 2021-02-15 DIAGNOSIS — M5127 Other intervertebral disc displacement, lumbosacral region: Secondary | ICD-10-CM

## 2021-02-15 DIAGNOSIS — M2550 Pain in unspecified joint: Secondary | ICD-10-CM

## 2021-02-15 DIAGNOSIS — R002 Palpitations: Secondary | ICD-10-CM

## 2021-02-15 DIAGNOSIS — Z8709 Personal history of other diseases of the respiratory system: Secondary | ICD-10-CM

## 2021-02-15 DIAGNOSIS — E042 Nontoxic multinodular goiter: Secondary | ICD-10-CM

## 2021-02-20 ENCOUNTER — Other Ambulatory Visit: Payer: Self-pay | Admitting: Cardiology

## 2021-02-20 ENCOUNTER — Other Ambulatory Visit (HOSPITAL_BASED_OUTPATIENT_CLINIC_OR_DEPARTMENT_OTHER): Payer: Self-pay

## 2021-02-20 DIAGNOSIS — L739 Follicular disorder, unspecified: Secondary | ICD-10-CM | POA: Diagnosis not present

## 2021-02-20 DIAGNOSIS — L814 Other melanin hyperpigmentation: Secondary | ICD-10-CM | POA: Diagnosis not present

## 2021-02-20 DIAGNOSIS — L719 Rosacea, unspecified: Secondary | ICD-10-CM | POA: Diagnosis not present

## 2021-02-20 DIAGNOSIS — L579 Skin changes due to chronic exposure to nonionizing radiation, unspecified: Secondary | ICD-10-CM | POA: Diagnosis not present

## 2021-02-20 DIAGNOSIS — L905 Scar conditions and fibrosis of skin: Secondary | ICD-10-CM | POA: Diagnosis not present

## 2021-02-20 MED ORDER — TELMISARTAN 80 MG PO TABS
80.0000 mg | ORAL_TABLET | Freq: Every day | ORAL | 1 refills | Status: DC
  Filled 2021-02-20: qty 90, 90d supply, fill #0
  Filled 2021-05-27: qty 90, 90d supply, fill #1

## 2021-02-20 MED FILL — Semaglutide (Weight Mngmt) Soln Auto-Injector 2.4 MG/0.75ML: SUBCUTANEOUS | 28 days supply | Qty: 2.25 | Fill #0 | Status: CN

## 2021-02-22 ENCOUNTER — Other Ambulatory Visit (HOSPITAL_BASED_OUTPATIENT_CLINIC_OR_DEPARTMENT_OTHER): Payer: Self-pay

## 2021-02-22 MED ORDER — TRETINOIN 0.025 % EX CREA
TOPICAL_CREAM | CUTANEOUS | 3 refills | Status: DC
  Filled 2021-02-22: qty 20, 30d supply, fill #0

## 2021-02-22 MED ORDER — METRONIDAZOLE 0.75 % EX CREA
TOPICAL_CREAM | CUTANEOUS | 2 refills | Status: DC
  Filled 2021-02-22: qty 45, 30d supply, fill #0
  Filled 2021-12-05: qty 45, 30d supply, fill #1

## 2021-02-25 ENCOUNTER — Other Ambulatory Visit (HOSPITAL_BASED_OUTPATIENT_CLINIC_OR_DEPARTMENT_OTHER): Payer: Self-pay

## 2021-02-28 DIAGNOSIS — H6123 Impacted cerumen, bilateral: Secondary | ICD-10-CM | POA: Diagnosis not present

## 2021-03-01 ENCOUNTER — Other Ambulatory Visit (HOSPITAL_BASED_OUTPATIENT_CLINIC_OR_DEPARTMENT_OTHER): Payer: Self-pay

## 2021-03-01 MED ORDER — SEMAGLUTIDE-WEIGHT MANAGEMENT 2.4 MG/0.75ML ~~LOC~~ SOAJ
1.0000 | SUBCUTANEOUS | 0 refills | Status: DC
  Filled 2021-03-01: qty 3, 28d supply, fill #0
  Filled 2021-03-18: qty 6, 56d supply, fill #1
  Filled 2021-03-25: qty 3, 28d supply, fill #1
  Filled 2021-08-08: qty 3, 28d supply, fill #2

## 2021-03-04 ENCOUNTER — Other Ambulatory Visit (HOSPITAL_BASED_OUTPATIENT_CLINIC_OR_DEPARTMENT_OTHER): Payer: Self-pay

## 2021-03-06 ENCOUNTER — Other Ambulatory Visit (HOSPITAL_BASED_OUTPATIENT_CLINIC_OR_DEPARTMENT_OTHER): Payer: Self-pay

## 2021-03-12 ENCOUNTER — Other Ambulatory Visit (HOSPITAL_BASED_OUTPATIENT_CLINIC_OR_DEPARTMENT_OTHER): Payer: Self-pay

## 2021-03-13 ENCOUNTER — Ambulatory Visit: Payer: 59 | Admitting: Rheumatology

## 2021-03-14 NOTE — Progress Notes (Deleted)
Office Visit Note  Patient: Lindsay Burke             Date of Birth: 14-Apr-1971           MRN: [id number]             PCP: Forrest Moron, MD Referring: [place] Crane* Visit Date: 03/27/2021 Occupation: @GUAROCC @  Subjective:  No chief complaint on file.   History of Present Illness: Lindsay Burke is a 50 y.o. female ***   Activities of Daily Living:  Patient reports morning stiffness for *** {minute/hour:19697}.   Patient {ACTIONS;DENIES/REPORTS:21021675::"Denies"} nocturnal pain.  Difficulty dressing/grooming: {ACTIONS;DENIES/REPORTS:21021675::"Denies"} Difficulty climbing stairs: {ACTIONS;DENIES/REPORTS:21021675::"Denies"} Difficulty getting out of chair: {ACTIONS;DENIES/REPORTS:21021675::"Denies"} Difficulty using hands for taps, buttons, cutlery, and/or writing: {ACTIONS;DENIES/REPORTS:21021675::"Denies"}  No Rheumatology ROS completed.   PMFS History:  Patient Active Problem List   Diagnosis Date Noted  . Palpitations 08/28/2020  . Dyspnea due to COVID-19 12/21/2019  . COVID-19 10/25/2019  . Essential hypertension 09/05/2019  . Malaise and fatigue 09/05/2019  . Cardiac risk counseling 09/05/2019  . Snores 09/05/2019  . Mild intermittent asthma with acute exacerbation 07/09/2018  . Vitamin D deficiency 07/09/2018  . Fibrocystic breast changes, left 11/09/2017  . Hashimoto's disease 06/19/2017  . Abnormal uterine bleeding 03/12/2016  . History of endometrial ablation 03/12/2016  . Menorrhagia with irregular cycle 03/12/2016  . Thyroid disorder 03/12/2016  . Abnormality of left breast on screening mammogram 12/03/2015  . 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 Protrusion of intervertebral disc of lumbosacral region 02/21/2015  . Right-sided low back pain without sciatica 01/30/2015  . SI (sacroiliac) joint dysfunction 01/30/2015  . History of renal stone 03/08/2014  . H/O knee surgery 03/08/2014  . Hx of cholecystectomy 03/08/2014   . Fatigue 01/18/2014  . Multinodular goiter 01/18/2014    Past Medical History:  Diagnosis Date  . Asthma   . Hypertension   . Thyroid disease     Family History  Problem Relation Age of Onset  . Hypertension Mother   . Heart attack Father   . Hypertension Father    Past Surgical History:  Procedure Laterality Date  . CHOLECYSTECTOMY     Social History   Social History Narrative  . Not on file   Immunization History  Administered Date(s) Administered  . Influenza-Unspecified 08/09/2015     Objective: Vital Signs: There were no vitals taken for this visit.   Physical Exam   Musculoskeletal Exam: ***  CDAI Exam: CDAI Score: -- Patient Global: --; Provider Global: -- Swollen: --; Tender: -- Joint Exam 03/27/2021   No joint exam has been documented for this visit   There is currently no information documented on the homunculus. Go to the Rheumatology activity and complete the homunculus joint exam.  Investigation: No additional findings.  Imaging: No results found.  Recent Labs: Lab Results  Component Value Date   WBC 3.8 (L) 10/27/2019   HGB 12.6 10/27/2019   PLT 139 (L) 10/27/2019   NA 141 08/28/2020   K 4.4 08/28/2020   CL 106 08/28/2020   CO2 20 08/28/2020   GLUCOSE 81 08/28/2020   BUN 13 08/28/2020   CREATININE 0.78 08/28/2020   BILITOT 0.3 08/28/2020   ALKPHOS 67 08/28/2020   AST 13 08/28/2020   ALT 24 08/28/2020   PROT 7.0 08/28/2020   ALBUMIN 4.7 08/28/2020   CALCIUM 9.3 08/28/2020   GFRAA 103 08/28/2020    Speciality Comments: No specialty comments available.  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  No orders of the defined types were placed in this encounter.   Face-to-face time spent with patient was *** minutes. Greater than 50% of time was spent in counseling and coordination of care.  Follow-Up Instructions: No follow-ups on file.   Ofilia Neas, PA-C  Note - This record has been created using Dragon software.  Chart creation errors have been sought, but may not always  have been located. Such creation errors do not reflect on  the standard of medical care.,

## 2021-03-18 ENCOUNTER — Other Ambulatory Visit (HOSPITAL_BASED_OUTPATIENT_CLINIC_OR_DEPARTMENT_OTHER): Payer: Self-pay

## 2021-03-18 DIAGNOSIS — D235 Other benign neoplasm of skin of trunk: Secondary | ICD-10-CM | POA: Diagnosis not present

## 2021-03-18 DIAGNOSIS — D485 Neoplasm of uncertain behavior of skin: Secondary | ICD-10-CM | POA: Diagnosis not present

## 2021-03-18 DIAGNOSIS — L821 Other seborrheic keratosis: Secondary | ICD-10-CM | POA: Diagnosis not present

## 2021-03-18 DIAGNOSIS — D225 Melanocytic nevi of trunk: Secondary | ICD-10-CM | POA: Diagnosis not present

## 2021-03-18 DIAGNOSIS — L579 Skin changes due to chronic exposure to nonionizing radiation, unspecified: Secondary | ICD-10-CM | POA: Diagnosis not present

## 2021-03-18 DIAGNOSIS — D1801 Hemangioma of skin and subcutaneous tissue: Secondary | ICD-10-CM | POA: Diagnosis not present

## 2021-03-20 ENCOUNTER — Other Ambulatory Visit (HOSPITAL_BASED_OUTPATIENT_CLINIC_OR_DEPARTMENT_OTHER): Payer: Self-pay

## 2021-03-22 ENCOUNTER — Other Ambulatory Visit (HOSPITAL_BASED_OUTPATIENT_CLINIC_OR_DEPARTMENT_OTHER): Payer: Self-pay

## 2021-03-22 ENCOUNTER — Ambulatory Visit: Payer: 59 | Admitting: Rheumatology

## 2021-03-25 ENCOUNTER — Other Ambulatory Visit (HOSPITAL_BASED_OUTPATIENT_CLINIC_OR_DEPARTMENT_OTHER): Payer: Self-pay

## 2021-03-25 ENCOUNTER — Ambulatory Visit: Payer: 59 | Admitting: Rheumatology

## 2021-03-26 ENCOUNTER — Other Ambulatory Visit (HOSPITAL_BASED_OUTPATIENT_CLINIC_OR_DEPARTMENT_OTHER): Payer: Self-pay

## 2021-03-27 ENCOUNTER — Ambulatory Visit: Payer: 59 | Admitting: Rheumatology

## 2021-03-27 DIAGNOSIS — M5126 Other intervertebral disc displacement, lumbar region: Secondary | ICD-10-CM

## 2021-03-27 DIAGNOSIS — Z8709 Personal history of other diseases of the respiratory system: Secondary | ICD-10-CM

## 2021-03-27 DIAGNOSIS — M2550 Pain in unspecified joint: Secondary | ICD-10-CM

## 2021-03-27 DIAGNOSIS — Z9049 Acquired absence of other specified parts of digestive tract: Secondary | ICD-10-CM

## 2021-03-27 DIAGNOSIS — I10 Essential (primary) hypertension: Secondary | ICD-10-CM

## 2021-03-27 DIAGNOSIS — E559 Vitamin D deficiency, unspecified: Secondary | ICD-10-CM

## 2021-03-27 DIAGNOSIS — E063 Autoimmune thyroiditis: Secondary | ICD-10-CM

## 2021-03-27 DIAGNOSIS — Z87442 Personal history of urinary calculi: Secondary | ICD-10-CM

## 2021-03-27 DIAGNOSIS — M545 Low back pain, unspecified: Secondary | ICD-10-CM

## 2021-03-27 DIAGNOSIS — M533 Sacrococcygeal disorders, not elsewhere classified: Secondary | ICD-10-CM

## 2021-03-27 DIAGNOSIS — R002 Palpitations: Secondary | ICD-10-CM

## 2021-03-27 DIAGNOSIS — M4722 Other spondylosis with radiculopathy, cervical region: Secondary | ICD-10-CM

## 2021-03-27 DIAGNOSIS — E042 Nontoxic multinodular goiter: Secondary | ICD-10-CM

## 2021-03-27 DIAGNOSIS — R5383 Other fatigue: Secondary | ICD-10-CM

## 2021-03-27 DIAGNOSIS — M791 Myalgia, unspecified site: Secondary | ICD-10-CM

## 2021-03-27 DIAGNOSIS — Z9889 Other specified postprocedural states: Secondary | ICD-10-CM

## 2021-03-27 DIAGNOSIS — N6012 Diffuse cystic mastopathy of left breast: Secondary | ICD-10-CM

## 2021-03-27 MED FILL — Norethindrone Acetate Tab 5 MG: ORAL | 90 days supply | Qty: 90 | Fill #0 | Status: AC

## 2021-03-28 ENCOUNTER — Other Ambulatory Visit (HOSPITAL_BASED_OUTPATIENT_CLINIC_OR_DEPARTMENT_OTHER): Payer: Self-pay

## 2021-03-29 ENCOUNTER — Other Ambulatory Visit (HOSPITAL_BASED_OUTPATIENT_CLINIC_OR_DEPARTMENT_OTHER): Payer: Self-pay

## 2021-05-11 DIAGNOSIS — R222 Localized swelling, mass and lump, trunk: Secondary | ICD-10-CM | POA: Diagnosis not present

## 2021-05-14 ENCOUNTER — Other Ambulatory Visit (HOSPITAL_BASED_OUTPATIENT_CLINIC_OR_DEPARTMENT_OTHER): Payer: Self-pay | Admitting: Physician Assistant

## 2021-05-14 DIAGNOSIS — R52 Pain, unspecified: Secondary | ICD-10-CM

## 2021-05-25 ENCOUNTER — Ambulatory Visit (HOSPITAL_BASED_OUTPATIENT_CLINIC_OR_DEPARTMENT_OTHER)
Admission: RE | Admit: 2021-05-25 | Discharge: 2021-05-25 | Disposition: A | Discharge status: Home / Self Care | Payer: 59 | Source: Ambulatory Visit | Attending: Physician Assistant | Admitting: Physician Assistant

## 2021-05-25 ENCOUNTER — Other Ambulatory Visit: Payer: Self-pay

## 2021-05-25 DIAGNOSIS — R52 Pain, unspecified: Secondary | ICD-10-CM | POA: Insufficient documentation

## 2021-05-25 DIAGNOSIS — J90 Pleural effusion, not elsewhere classified: Secondary | ICD-10-CM | POA: Diagnosis not present

## 2021-05-25 DIAGNOSIS — S22009A Unspecified fracture of unspecified thoracic vertebra, initial encounter for closed fracture: Secondary | ICD-10-CM | POA: Diagnosis not present

## 2021-05-25 DIAGNOSIS — M50223 Other cervical disc displacement at C6-C7 level: Secondary | ICD-10-CM | POA: Diagnosis not present

## 2021-05-25 DIAGNOSIS — M4184 Other forms of scoliosis, thoracic region: Secondary | ICD-10-CM | POA: Diagnosis not present

## 2021-05-27 ENCOUNTER — Telehealth: Payer: Self-pay | Admitting: Pharmacist

## 2021-05-27 ENCOUNTER — Other Ambulatory Visit (HOSPITAL_BASED_OUTPATIENT_CLINIC_OR_DEPARTMENT_OTHER): Payer: Self-pay | Admitting: Obstetrics and Gynecology

## 2021-05-27 ENCOUNTER — Other Ambulatory Visit (HOSPITAL_BASED_OUTPATIENT_CLINIC_OR_DEPARTMENT_OTHER): Payer: Self-pay

## 2021-05-27 DIAGNOSIS — Z1231 Encounter for screening mammogram for malignant neoplasm of breast: Secondary | ICD-10-CM

## 2021-05-27 MED ORDER — CARVEDILOL 12.5 MG PO TABS
12.5000 mg | ORAL_TABLET | Freq: Two times a day (BID) | ORAL | 5 refills | Status: DC
  Filled 2021-05-27: qty 60, 30d supply, fill #0
  Filled 2021-07-26 – 2021-08-08 (×2): qty 60, 30d supply, fill #1

## 2021-05-27 NOTE — Telephone Encounter (Signed)
Pt left voicemail reporting some orthostatic symptoms and lower BP since she started [facility] and has lost 30 lbs over the past 6 months.  Returned call to pt, she reports some BP of 100/60 and that she cut her carvedilol to '25mg'$  once daily 3 months ago. Has continued HCTZ 12.'5mg'$  daily and telmisartan '80mg'$  daily. Actually had a fall this AM from orthostasis.  Will stop HCTZ, continue telmisartan '80mg'$  daily, and advised pt that carvedilol needs to be taken twice daily - will decrease to 12.'5mg'$  BID.  She'll give [place] call in a few weeks with an update on home BP readings. She wishes to continue decreasing her carvedilol (takes this for palpitations) due to some fatigue.

## 2021-05-30 DIAGNOSIS — M4726 Other spondylosis with radiculopathy, lumbar region: Secondary | ICD-10-CM | POA: Diagnosis not present

## 2021-05-30 DIAGNOSIS — M40209 Unspecified kyphosis, site unspecified: Secondary | ICD-10-CM | POA: Diagnosis not present

## 2021-05-30 DIAGNOSIS — M4722 Other spondylosis with radiculopathy, cervical region: Secondary | ICD-10-CM | POA: Diagnosis not present

## 2021-05-30 DIAGNOSIS — M50121 Cervical disc disorder at C4-C5 level with radiculopathy: Secondary | ICD-10-CM | POA: Diagnosis not present

## 2021-05-31 DIAGNOSIS — E663 Overweight: Secondary | ICD-10-CM | POA: Diagnosis not present

## 2021-05-31 DIAGNOSIS — D171 Benign lipomatous neoplasm of skin and subcutaneous tissue of trunk: Secondary | ICD-10-CM | POA: Diagnosis not present

## 2021-05-31 DIAGNOSIS — E042 Nontoxic multinodular goiter: Secondary | ICD-10-CM | POA: Diagnosis not present

## 2021-05-31 DIAGNOSIS — R55 Syncope and collapse: Secondary | ICD-10-CM | POA: Diagnosis not present

## 2021-05-31 DIAGNOSIS — R5383 Other fatigue: Secondary | ICD-10-CM | POA: Diagnosis not present

## 2021-05-31 DIAGNOSIS — E039 Hypothyroidism, unspecified: Secondary | ICD-10-CM | POA: Diagnosis not present

## 2021-05-31 DIAGNOSIS — E785 Hyperlipidemia, unspecified: Secondary | ICD-10-CM | POA: Diagnosis not present

## 2021-05-31 DIAGNOSIS — E569 Vitamin deficiency, unspecified: Secondary | ICD-10-CM | POA: Diagnosis not present

## 2021-05-31 DIAGNOSIS — D179 Benign lipomatous neoplasm, unspecified: Secondary | ICD-10-CM | POA: Diagnosis not present

## 2021-06-03 ENCOUNTER — Other Ambulatory Visit (HOSPITAL_BASED_OUTPATIENT_CLINIC_OR_DEPARTMENT_OTHER): Payer: Self-pay

## 2021-06-03 MED ORDER — WEGOVY 2.4 MG/0.75ML ~~LOC~~ SOAJ
SUBCUTANEOUS | 0 refills | Status: DC
  Filled 2021-06-03: qty 3, 28d supply, fill #0

## 2021-06-04 ENCOUNTER — Other Ambulatory Visit (HOSPITAL_BASED_OUTPATIENT_CLINIC_OR_DEPARTMENT_OTHER): Payer: Self-pay

## 2021-06-04 ENCOUNTER — Other Ambulatory Visit (HOSPITAL_BASED_OUTPATIENT_CLINIC_OR_DEPARTMENT_OTHER): Payer: Self-pay | Admitting: Internal Medicine

## 2021-06-04 ENCOUNTER — Ambulatory Visit (HOSPITAL_BASED_OUTPATIENT_CLINIC_OR_DEPARTMENT_OTHER)
Admission: RE | Admit: 2021-06-04 | Discharge: 2021-06-04 | Disposition: A | Discharge status: Home / Self Care | Payer: 59 | Source: Ambulatory Visit | Attending: Internal Medicine | Admitting: Internal Medicine

## 2021-06-04 ENCOUNTER — Other Ambulatory Visit: Payer: Self-pay

## 2021-06-04 DIAGNOSIS — Z0389 Encounter for observation for other suspected diseases and conditions ruled out: Secondary | ICD-10-CM | POA: Diagnosis not present

## 2021-06-04 DIAGNOSIS — D1723 Benign lipomatous neoplasm of skin and subcutaneous tissue of right leg: Secondary | ICD-10-CM

## 2021-06-07 ENCOUNTER — Other Ambulatory Visit (HOSPITAL_BASED_OUTPATIENT_CLINIC_OR_DEPARTMENT_OTHER): Payer: Self-pay

## 2021-06-07 ENCOUNTER — Other Ambulatory Visit (HOSPITAL_BASED_OUTPATIENT_CLINIC_OR_DEPARTMENT_OTHER): Payer: Self-pay | Admitting: Orthopedic Surgery

## 2021-06-07 DIAGNOSIS — M4722 Other spondylosis with radiculopathy, cervical region: Secondary | ICD-10-CM

## 2021-06-10 ENCOUNTER — Other Ambulatory Visit (HOSPITAL_BASED_OUTPATIENT_CLINIC_OR_DEPARTMENT_OTHER): Payer: Self-pay

## 2021-06-11 ENCOUNTER — Ambulatory Visit (HOSPITAL_BASED_OUTPATIENT_CLINIC_OR_DEPARTMENT_OTHER)
Admission: RE | Admit: 2021-06-11 | Discharge: 2021-06-11 | Disposition: A | Discharge status: Home / Self Care | Payer: 59 | Source: Ambulatory Visit | Attending: Obstetrics and Gynecology | Admitting: Obstetrics and Gynecology

## 2021-06-11 ENCOUNTER — Other Ambulatory Visit (HOSPITAL_BASED_OUTPATIENT_CLINIC_OR_DEPARTMENT_OTHER): Payer: Self-pay

## 2021-06-11 ENCOUNTER — Ambulatory Visit (HOSPITAL_BASED_OUTPATIENT_CLINIC_OR_DEPARTMENT_OTHER): Payer: 59

## 2021-06-11 ENCOUNTER — Encounter (HOSPITAL_BASED_OUTPATIENT_CLINIC_OR_DEPARTMENT_OTHER): Payer: Self-pay

## 2021-06-11 ENCOUNTER — Other Ambulatory Visit: Payer: Self-pay

## 2021-06-11 DIAGNOSIS — Z1231 Encounter for screening mammogram for malignant neoplasm of breast: Secondary | ICD-10-CM | POA: Diagnosis not present

## 2021-06-13 ENCOUNTER — Other Ambulatory Visit (HOSPITAL_BASED_OUTPATIENT_CLINIC_OR_DEPARTMENT_OTHER): Payer: Self-pay

## 2021-06-15 ENCOUNTER — Ambulatory Visit (HOSPITAL_BASED_OUTPATIENT_CLINIC_OR_DEPARTMENT_OTHER): Admission: RE | Admit: 2021-06-15 | Payer: 59 | Source: Ambulatory Visit

## 2021-06-15 ENCOUNTER — Ambulatory Visit (HOSPITAL_BASED_OUTPATIENT_CLINIC_OR_DEPARTMENT_OTHER)
Admission: RE | Admit: 2021-06-15 | Discharge: 2021-06-15 | Disposition: A | Discharge status: Home / Self Care | Payer: 59 | Source: Ambulatory Visit | Attending: Orthopedic Surgery | Admitting: Orthopedic Surgery

## 2021-06-15 ENCOUNTER — Other Ambulatory Visit: Payer: Self-pay

## 2021-06-15 DIAGNOSIS — M542 Cervicalgia: Secondary | ICD-10-CM | POA: Diagnosis not present

## 2021-06-15 DIAGNOSIS — M4722 Other spondylosis with radiculopathy, cervical region: Secondary | ICD-10-CM | POA: Diagnosis not present

## 2021-06-18 ENCOUNTER — Other Ambulatory Visit (HOSPITAL_BASED_OUTPATIENT_CLINIC_OR_DEPARTMENT_OTHER): Payer: Self-pay

## 2021-06-18 IMAGING — MR MR CERVICAL SPINE W/O CM
5 of 6 series · 30 of 48 positions shown · non-contrast
Comparison: None available.

CLINICAL DATA: Initial evaluation for chronic neck pain with new
onset right upper extremity pain with numbness and tingling for 6
weeks.

EXAM:
MRI CERVICAL SPINE WITHOUT CONTRAST
TECHNIQUE: Multiplanar, multisequence MR imaging of the cervical spine was
performed. No intravenous contrast was administered.

[Series 2: (id) tse sag · sagittal · 3.0mm · 0.41mm/px · 5 of 13 slices shown (1 of 2)]
[im 1/13]
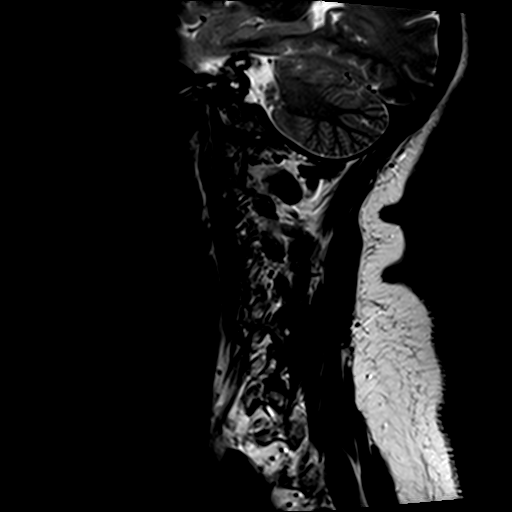
[im 4/13]
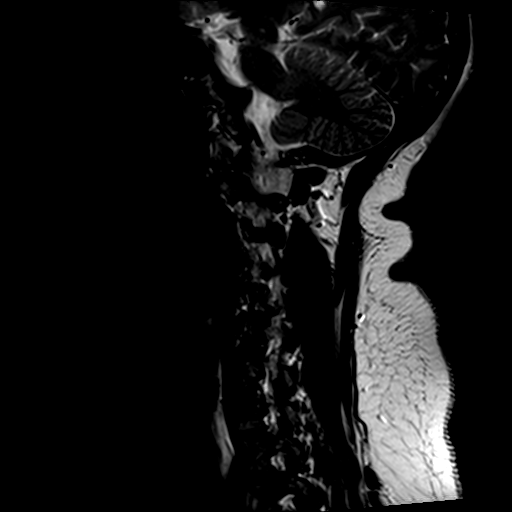
[im 7/13]
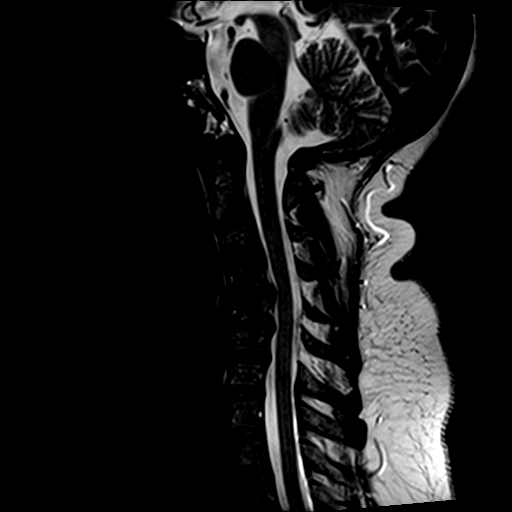
[im 10/13]
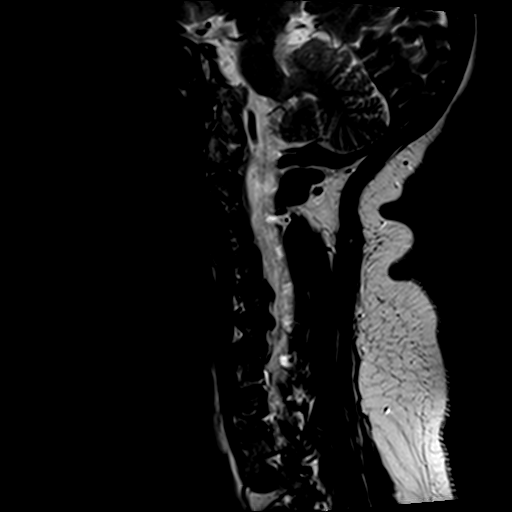
[im 13/13]
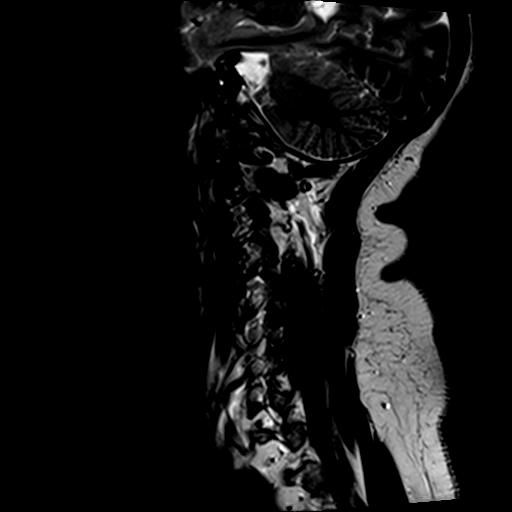

[Series 3: (id) tse sag · sagittal · 3.0mm · 0.41mm/px · 1 of 13 slices shown (2 of 2)]
[im 1/13]
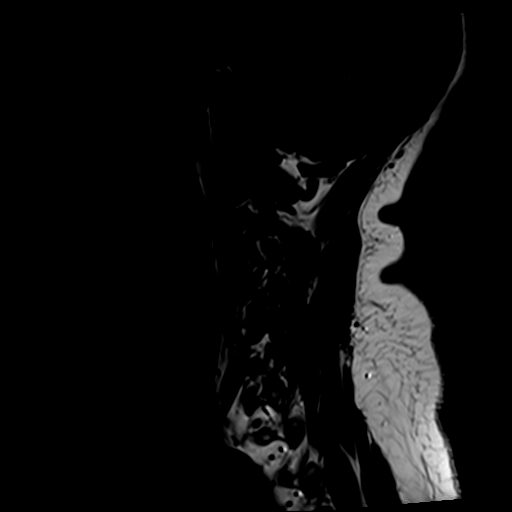

[Series 4: STIR · sagittal · 3.0mm · 0.82mm/px · 6 of 13 slices shown]
[im 1/13]
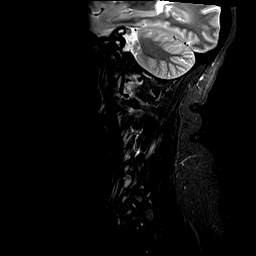
[im 3/13]
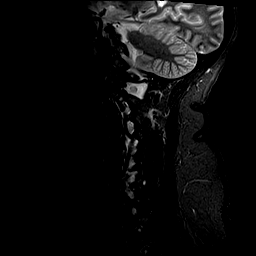
[im 5/13]
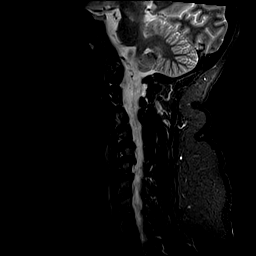
[im 8/13]
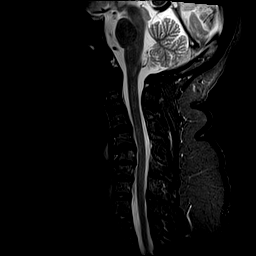
[im 10/13]
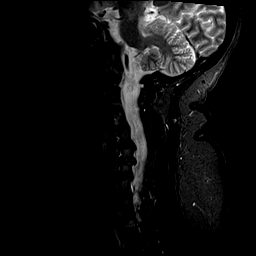
[im 13/13]
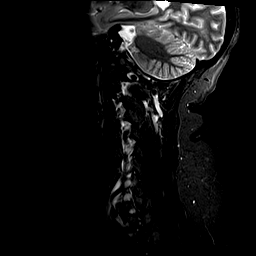

[Series 5: T2 · axial · 3.0mm · 0.39mm/px · z∈[-58,+47]mm · 9 of 30 slices shown (1 of 2)]
[im 1/30]
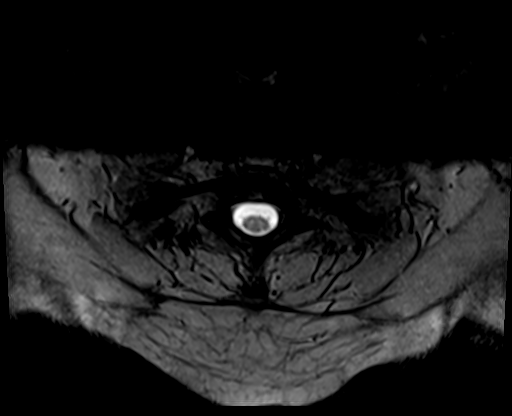
[im 5/30]
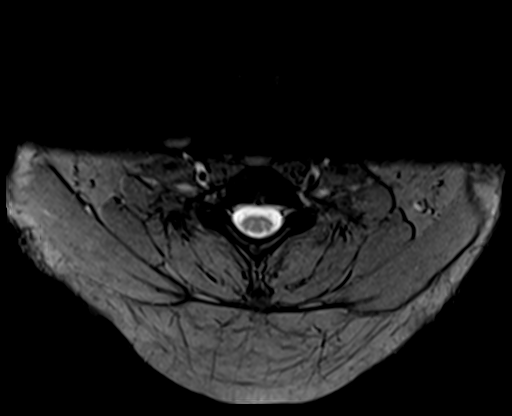
[im 10/30]
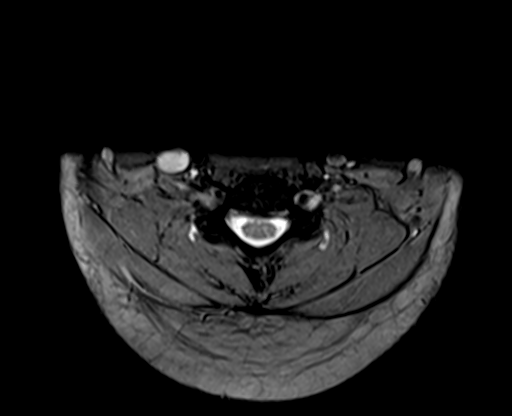
[im 13/30]
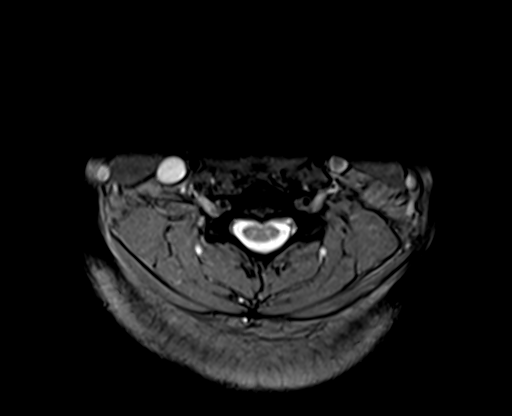
[im 15/30]
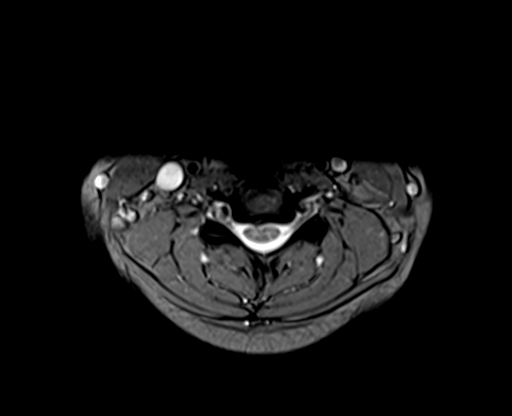
[im 17/30]
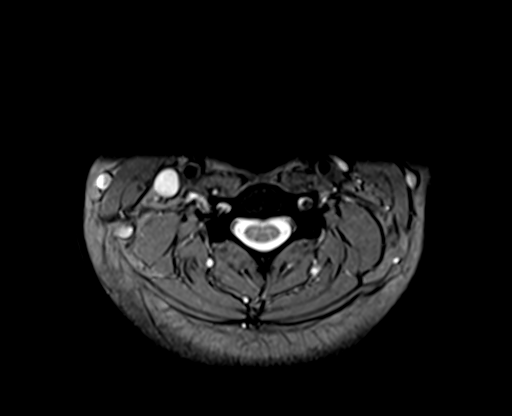
[im 20/30]
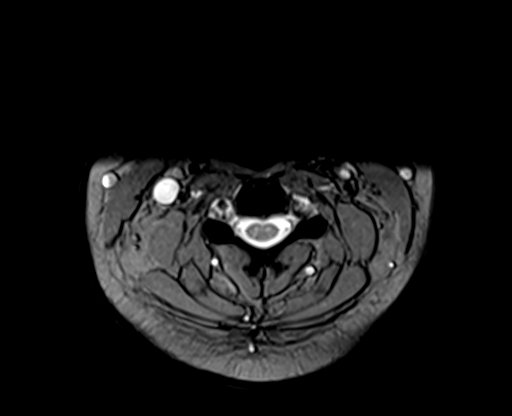
[im 25/30]
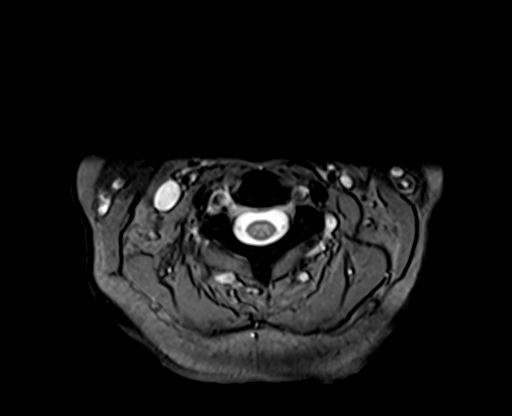
[im 30/30]
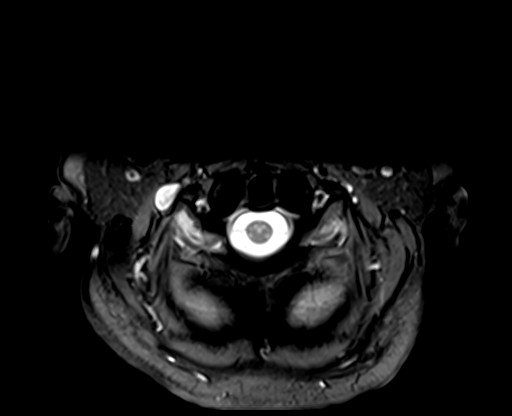

[Series 6: T2 · axial · 3.0mm · 0.78mm/px · z∈[-58,+46]mm · 9 of 30 slices shown (2 of 2)]
[im 1/30]
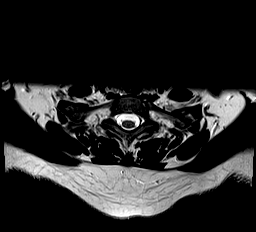
[im 5/30]
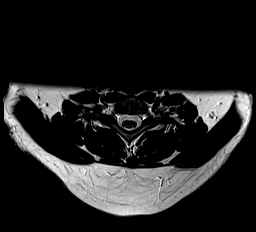
[im 10/30]
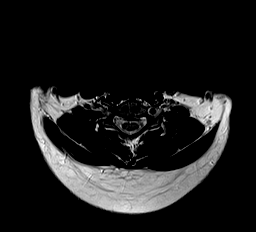
[im 13/30]
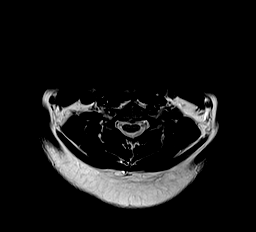
[im 15/30]
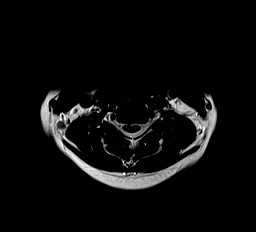
[im 17/30]
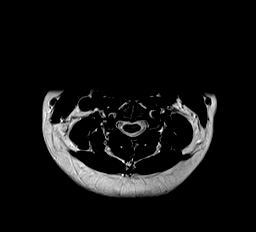
[im 20/30]
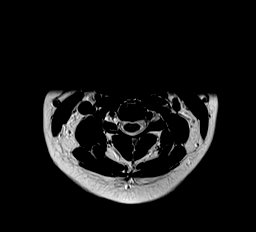
[im 25/30]
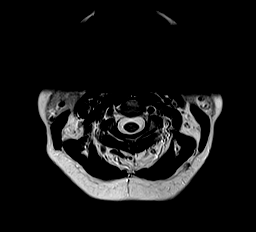
[im 30/30]
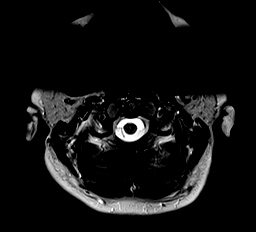

[30 of 48 positions shown; findings below may reference images not displayed]

FINDINGS: Alignment: Reversal of the normal cervical lordosis with apex at C5.
Trace anterolisthesis of C3 on C4 and C4 on C5, chronic and
degenerative.

Vertebrae: Vertebral body height maintained without evidence for
acute or chronic fracture. Bone marrow signal intensity within
normal limits. No discrete or worrisome osseous lesions. No abnormal
marrow edema.

Cord: Signal intensity within the cervical spinal cord is normal.

Posterior Fossa, vertebral arteries, paraspinal tissues: Visualized
brain and posterior fossa within normal limits. Craniocervical
junction normal. Paraspinous and prevertebral soft tissues within
normal limits. Normal intravascular flow voids seen within the
vertebral arteries bilaterally.

Disc levels:

C2-C3: Unremarkable.

C3-C4: Tiny central disc protrusion minimally indents the ventral
thecal sac. No significant stenosis or cord deformity. Foramina
remain widely patent.

C4-C5: Mild diffuse disc bulge with bilateral uncovertebral
hypertrophy. Bulging disc flattens and partially faces the ventral
thecal sac and resultant mild spinal stenosis. Superimposed tiny
right paracentral disc protrusion contacts the right ventral cord,
potentially affecting the ventral right C5 nerve root (series 5,
image 16). Associated slight superior migration noted (series 2,
image 7). Foramina remain patent.

C5-C6: Chronic intervertebral disc space narrowing with diffuse
degenerative disc osteophyte. Flattening and partial effacement of
the ventral thecal sac with resultant mild spinal stenosis. No cord
deformity. Mild to moderate bilateral C6 foraminal narrowing.

C6-C7: Mild disc bulge with uncovertebral hypertrophy. Mild
flattening of the ventral thecal sac without significant spinal
stenosis. Mild to moderate left C7 foraminal stenosis. No right
foraminal narrowing.

C7-T1:  Unremarkable.

Visualized upper thoracic spine demonstrates no significant finding.
IMPRESSION: 1. Disc bulging with superimposed tiny right paracentral disc
protrusion at C4-5, contacting the right ventral cord and
potentially affecting the ventral right C5 nerve root.
2. Disc bulging with bilateral uncovertebral hypertrophy at C5-6
with resultant mild canal, with mild to moderate bilateral C6
foraminal narrowing.
3. Mild to moderate left C7 foraminal stenosis related to disc bulge
and uncovertebral disease.

## 2021-06-21 ENCOUNTER — Other Ambulatory Visit (HOSPITAL_BASED_OUTPATIENT_CLINIC_OR_DEPARTMENT_OTHER): Payer: Self-pay

## 2021-06-21 DIAGNOSIS — M47814 Spondylosis without myelopathy or radiculopathy, thoracic region: Secondary | ICD-10-CM | POA: Diagnosis not present

## 2021-06-21 DIAGNOSIS — M50122 Cervical disc disorder at C5-C6 level with radiculopathy: Secondary | ICD-10-CM | POA: Diagnosis not present

## 2021-06-21 DIAGNOSIS — M5116 Intervertebral disc disorders with radiculopathy, lumbar region: Secondary | ICD-10-CM | POA: Diagnosis not present

## 2021-06-21 DIAGNOSIS — M40209 Unspecified kyphosis, site unspecified: Secondary | ICD-10-CM | POA: Diagnosis not present

## 2021-06-24 ENCOUNTER — Other Ambulatory Visit (HOSPITAL_BASED_OUTPATIENT_CLINIC_OR_DEPARTMENT_OTHER): Payer: Self-pay

## 2021-06-28 ENCOUNTER — Other Ambulatory Visit (HOSPITAL_BASED_OUTPATIENT_CLINIC_OR_DEPARTMENT_OTHER): Payer: Self-pay

## 2021-07-17 ENCOUNTER — Ambulatory Visit: Payer: 59 | Admitting: Rheumatology

## 2021-07-17 ENCOUNTER — Other Ambulatory Visit (HOSPITAL_BASED_OUTPATIENT_CLINIC_OR_DEPARTMENT_OTHER): Payer: Self-pay

## 2021-07-17 MED ORDER — NORETHINDRONE ACETATE 5 MG PO TABS
5.0000 mg | ORAL_TABLET | Freq: Every day | ORAL | 0 refills | Status: DC
  Filled 2021-07-17: qty 90, 90d supply, fill #0

## 2021-07-29 ENCOUNTER — Other Ambulatory Visit (HOSPITAL_BASED_OUTPATIENT_CLINIC_OR_DEPARTMENT_OTHER): Payer: Self-pay

## 2021-08-06 ENCOUNTER — Other Ambulatory Visit (HOSPITAL_BASED_OUTPATIENT_CLINIC_OR_DEPARTMENT_OTHER): Payer: Self-pay

## 2021-08-08 ENCOUNTER — Ambulatory Visit: Payer: 59 | Admitting: Rheumatology

## 2021-08-08 ENCOUNTER — Other Ambulatory Visit (HOSPITAL_BASED_OUTPATIENT_CLINIC_OR_DEPARTMENT_OTHER): Payer: Self-pay

## 2021-08-19 ENCOUNTER — Other Ambulatory Visit (HOSPITAL_BASED_OUTPATIENT_CLINIC_OR_DEPARTMENT_OTHER): Payer: Self-pay

## 2021-08-19 DIAGNOSIS — Z1151 Encounter for screening for human papillomavirus (HPV): Secondary | ICD-10-CM | POA: Diagnosis not present

## 2021-08-19 DIAGNOSIS — Z01419 Encounter for gynecological examination (general) (routine) without abnormal findings: Secondary | ICD-10-CM | POA: Diagnosis not present

## 2021-08-19 DIAGNOSIS — N921 Excessive and frequent menstruation with irregular cycle: Secondary | ICD-10-CM | POA: Diagnosis not present

## 2021-08-19 DIAGNOSIS — Z79899 Other long term (current) drug therapy: Secondary | ICD-10-CM | POA: Diagnosis not present

## 2021-08-19 MED ORDER — NORETHINDRONE ACETATE 5 MG PO TABS: 5.0000 mg | ORAL_TABLET | Freq: Every day | ORAL | 3 refills | Status: DC

## 2021-08-21 DIAGNOSIS — R002 Palpitations: Secondary | ICD-10-CM | POA: Diagnosis not present

## 2021-08-21 DIAGNOSIS — E042 Nontoxic multinodular goiter: Secondary | ICD-10-CM | POA: Diagnosis not present

## 2021-08-21 DIAGNOSIS — R5383 Other fatigue: Secondary | ICD-10-CM | POA: Diagnosis not present

## 2021-08-22 DIAGNOSIS — J45909 Unspecified asthma, uncomplicated: Secondary | ICD-10-CM | POA: Insufficient documentation

## 2021-08-23 ENCOUNTER — Telehealth: Payer: Self-pay | Admitting: Cardiology

## 2021-08-23 ENCOUNTER — Ambulatory Visit: Payer: 59 | Admitting: Cardiology

## 2021-08-23 NOTE — Telephone Encounter (Signed)
Spoke with the patient, I tried to accommodated an appt next week in [place] however the patient declined stating she is not able to travel this far. I explained the soonest available in [place] is on 11/29 at 8am. Patient scheduled per her request.

## 2021-08-23 NOTE — Telephone Encounter (Signed)
Patient was scheduled for an appt for today with Dr. Agustin Cree for tachycardia episodes. She ended up having to take her son to the doctor to get tested for the flu. She wants to know if she can get scheduled for next week before she leaves out of town. I don't see anything open with any of the other provider's. She said if you could squeeze her in then send her a MyChart message. She had to rush to get off the phone because her son's doctor came into the room.

## 2021-09-10 ENCOUNTER — Ambulatory Visit: Payer: 59 | Admitting: Cardiology

## 2021-09-12 ENCOUNTER — Other Ambulatory Visit (HOSPITAL_BASED_OUTPATIENT_CLINIC_OR_DEPARTMENT_OTHER): Payer: Self-pay

## 2021-09-12 ENCOUNTER — Other Ambulatory Visit: Payer: Self-pay | Admitting: Cardiology

## 2021-09-16 ENCOUNTER — Encounter: Payer: Self-pay | Admitting: Pharmacist

## 2021-09-16 ENCOUNTER — Telehealth: Payer: Self-pay | Admitting: Pharmacist

## 2021-09-16 ENCOUNTER — Other Ambulatory Visit (HOSPITAL_BASED_OUTPATIENT_CLINIC_OR_DEPARTMENT_OTHER): Payer: Self-pay

## 2021-09-16 MED ORDER — HYDROCHLOROTHIAZIDE 12.5 MG PO CAPS
12.5000 mg | ORAL_CAPSULE | Freq: Every day | ORAL | 3 refills | Status: DC
  Filled 2021-09-16 – 2021-09-26 (×2): qty 90, 90d supply, fill #0
  Filled 2021-12-23: qty 90, 90d supply, fill #1
  Filled 2022-03-18: qty 90, 90d supply, fill #2
  Filled 2022-06-19: qty 90, 90d supply, fill #3

## 2021-09-16 NOTE — Telephone Encounter (Signed)
Patient called to let us know that she lost 37lb and had to make some changes to her BP medications. She is now taking 40mg  of telmisartan. She tried coming off of HCTZ but her BP went up and she had some swelling so she resumed 12.5mg  HCTZ. She states that she does not tolerate carvedilol 12.5mg  BID only 25mg  carvedilol daily at bedtime.  Repots BP in the AM is around 112/64 In the afternoon BP is <120/80 States she had a reaction to naproxen. HR was 120- felt like heart was pounding and Trick SOB. Lasted 24hr. Naproxen added to allergy list per pt request.  She also states she wants to start Louise. She does not want to take statins due to her chronic pain from degenerative disk. We did discuss that depending on her insurance, it may be hard to get it approved without trying a statin first. She understands this, but would like [place] to try if Dr. Bettina Gavia is ok with it.

## 2021-09-16 NOTE — Progress Notes (Deleted)
Cardiology Office Note:    Date:  09/17/2021   ID:  Lindsay Burke, DOB 12-22-1970, MRN [id number]  PCP:  Valinda Hoar, PA-C (Inactive)  Cardiologist:  Shirlee More, MD    Referring MD: No ref. provider found    ASSESSMENT:    1. Palpitations   2. Mitral valve prolapse   3. Multinodular goiter    PLAN:    In order of problems listed above:  ***   Next appointment: ***   Medication Adjustments/Labs and Tests Ordered: Current medicines are reviewed at length with the patient today.  Concerns regarding medicines are outlined above.  No orders of the defined types were placed in this encounter.  No orders of the defined types were placed in this encounter.   No chief complaint on file.  [facility]  History of Present Illness:    Lindsay Burke is a 50 y.o. female with a hx of hypertension mitral valve prolapse asthma and a coronary artery calcium score of 0 on previous CT scan last seen 08/28/2020 recovering from COVID-19 infection.  She is seen today at her request with frequent palpitation.  Compliance with diet, lifestyle and medications: ***  Echocardiogram 12/27/2019 showed normal left ventricular size systolic and diastolic function the right ventricle is also normal and there was no significant valvular abnormality. Her event monitor reported 09/14/2020 showed rare ventricular and supraventricular ectopy although her triggered and diary events were predominantly associated with PVCs.  She was recently seen by endocrinology for multi nodular goiter and complained of heart palpitation and fatigue.  Recent labs 08/21/2021 hemoglobin 12.3 platelets 202,000 Sodium 140 potassium 4.2 creatinine 0.93 normal liver function test GFR 75 cc/h Cholesterol 195 LDL 147 triglycerides 62 HDL 37 Free T3 normal 3.0 Past Medical History:  Diagnosis Date   Asthma    Hypertension    Thyroid disease     Past Surgical History:  Procedure Laterality Date   CHOLECYSTECTOMY       Current Medications: No outpatient medications have been marked as taking for the 09/17/21 encounter (Appointment) with Richardo Priest, MD.     Allergies:   Acetaminophen, Clindamycin/lincomycin, Phentermine, Chlorthalidone, Nifedipine, Clonidine, Hydralazine, Spironolactone, and Aleve [naproxen]   Social History   Socioeconomic History   Marital status: Married    Spouse name: Not on file   Number of children: Not on file   Years of education: Not on file   Highest education level: Not on file  Occupational History   Not on file  Tobacco Use   Smoking status: Never   Smokeless tobacco: Never  Vaping Use   Vaping Use: Never used  Substance and Sexual Activity   Alcohol use: Never   Drug use: Never   Sexual activity: Not on file    Comment: progestrone  Other Topics Concern   Not on file  Social History Narrative   Not on file   Social Determinants of Health   Financial Resource Strain: Not on file  Food Insecurity: Not on file  Transportation Needs: Not on file  Physical Activity: Not on file  Stress: Not on file  Social Connections: Not on file     Family History: The patient's ***family history includes Heart attack in her father; Hypertension in her father and mother. ROS:   Please see the history of present illness.    All other systems reviewed and are negative.  EKGs/Labs/Other Studies Reviewed:    The following studies were reviewed today:  EKG:  EKG ordered today and  personally reviewed.  The ekg ordered today demonstrates ***  Recent Labs: No results found for requested labs within last 8760 hours.  Recent Lipid Panel    Component Value Date/Time   CHOL 202 (H) 08/28/2020 0955   TRIG 115 08/28/2020 0955   HDL 35 (L) 08/28/2020 0955   CHOLHDL 5.8 (H) 08/28/2020 0955   LDLCALC 146 (H) 08/28/2020 0955    Physical Exam:    VS:  There were no vitals taken for this visit.    Wt Readings from Last 3 Encounters:  08/28/20 162 lb (73.5 kg)   12/21/19 167 lb (75.8 kg)  10/27/19 160 lb 15 oz (73 kg)     GEN: *** Well nourished, well developed in no acute distress HEENT: Normal NECK: No JVD; No carotid bruits LYMPHATICS: No lymphadenopathy CARDIAC: ***RRR, no murmurs, rubs, gallops RESPIRATORY:  Clear to auscultation without rales, wheezing or rhonchi  ABDOMEN: Soft, non-tender, non-distended MUSCULOSKELETAL:  No edema; No deformity  SKIN: Warm and dry NEUROLOGIC:  Alert and oriented x 3 PSYCHIATRIC:  Normal affect    Signed, Shirlee More, MD  09/17/2021 7:38 AM    [place]

## 2021-09-17 ENCOUNTER — Ambulatory Visit: Payer: 59 | Admitting: Cardiology

## 2021-09-25 ENCOUNTER — Other Ambulatory Visit (HOSPITAL_BASED_OUTPATIENT_CLINIC_OR_DEPARTMENT_OTHER): Payer: Self-pay

## 2021-09-26 ENCOUNTER — Other Ambulatory Visit (HOSPITAL_BASED_OUTPATIENT_CLINIC_OR_DEPARTMENT_OTHER): Payer: Self-pay

## 2021-09-26 ENCOUNTER — Other Ambulatory Visit: Payer: Self-pay | Admitting: Cardiology

## 2021-09-26 MED ORDER — CARVEDILOL 12.5 MG PO TABS
12.5000 mg | ORAL_TABLET | Freq: Two times a day (BID) | ORAL | 5 refills | Status: DC
  Filled 2021-09-26: qty 60, 30d supply, fill #0
  Filled 2021-10-22: qty 60, 30d supply, fill #1
  Filled 2021-11-24: qty 60, 30d supply, fill #2
  Filled 2021-12-23: qty 180, 90d supply, fill #3

## 2021-10-01 ENCOUNTER — Telehealth: Payer: 59

## 2021-10-03 ENCOUNTER — Other Ambulatory Visit (HOSPITAL_BASED_OUTPATIENT_CLINIC_OR_DEPARTMENT_OTHER): Payer: Self-pay

## 2021-10-03 DIAGNOSIS — J209 Acute bronchitis, unspecified: Secondary | ICD-10-CM | POA: Diagnosis not present

## 2021-10-03 MED ORDER — PROMETHAZINE-DM 6.25-15 MG/5ML PO SYRP
ORAL_SOLUTION | ORAL | 0 refills | Status: DC
  Filled 2021-10-03: qty 118, 7d supply, fill #0

## 2021-10-09 ENCOUNTER — Other Ambulatory Visit (HOSPITAL_BASED_OUTPATIENT_CLINIC_OR_DEPARTMENT_OTHER): Payer: Self-pay

## 2021-10-10 ENCOUNTER — Other Ambulatory Visit (HOSPITAL_BASED_OUTPATIENT_CLINIC_OR_DEPARTMENT_OTHER): Payer: Self-pay

## 2021-10-10 MED ORDER — NORETHINDRONE ACETATE 5 MG PO TABS
5.0000 mg | ORAL_TABLET | Freq: Every day | ORAL | 3 refills | Status: DC
  Filled 2021-10-10: qty 90, 90d supply, fill #0
  Filled 2022-01-07: qty 90, 90d supply, fill #1
  Filled 2022-04-16: qty 90, 90d supply, fill #2
  Filled 2022-07-14: qty 90, 90d supply, fill #3

## 2021-10-10 MED ORDER — EZETIMIBE 10 MG PO TABS
10.0000 mg | ORAL_TABLET | Freq: Every day | ORAL | 3 refills | Status: DC
  Filled 2021-10-10: qty 90, 90d supply, fill #0

## 2021-10-11 ENCOUNTER — Other Ambulatory Visit (HOSPITAL_BASED_OUTPATIENT_CLINIC_OR_DEPARTMENT_OTHER): Payer: Self-pay

## 2021-10-12 ENCOUNTER — Telehealth: Payer: 59 | Admitting: Nurse Practitioner

## 2021-10-12 DIAGNOSIS — J0101 Acute recurrent maxillary sinusitis: Secondary | ICD-10-CM | POA: Diagnosis not present

## 2021-10-12 MED ORDER — AZITHROMYCIN 250 MG PO TABS: ORAL_TABLET | ORAL | 0 refills | Status: DC

## 2021-10-12 MED ORDER — AZITHROMYCIN 250 MG PO TABS
ORAL_TABLET | ORAL | 0 refills | Status: DC
Start: 2021-10-12 — End: 2021-10-12
  Filled 2021-10-12: qty 6, fill #0

## 2021-10-12 NOTE — Progress Notes (Signed)
Virtual Visit Consent   Lindsay Burke, you are scheduled for a virtual visit with Lindsay Burke, [place], a [facility] provider, today.     Just as with appointments in the office, your consent must be obtained to participate.  Your consent will be active for this visit and any virtual visit you may have with one of our providers in the next 365 days.     If you have a MyChart account, a copy of this consent can be sent to you electronically.  All virtual visits are billed to your insurance company just like a traditional visit in the office.    As this is a virtual visit, video technology does not allow for your provider to perform a traditional examination.  This may limit your provider's ability to fully assess your condition.  If your provider identifies any concerns that need to be evaluated in person or the need to arrange testing (such as labs, EKG, etc.), we will make arrangements to do so.     Although advances in technology are sophisticated, we cannot ensure that it will always work on either your end or our end.  If the connection with a video visit is poor, the visit may have to be switched to a telephone visit.  With either a video or telephone visit, we are not always able to ensure that we have a secure connection.     I need to obtain your verbal consent now.   Are you willing to proceed with your visit today? YES   Lindsay Burke has provided verbal consent on 10/12/2021 for a virtual visit (video or telephone).   Lindsay Hassell Done, FNP   Date: 10/12/2021 1:21 PM   Virtual Visit via Video Note   I, Lindsay Burke, connected with Lindsay Burke ([id number], March 31, 1971) on 10/12/21 at  1:15 PM EST by a video-enabled telemedicine application and verified that I am speaking with the correct person using two identifiers.  Location: Patient: Virtual Visit Location Patient: Mobile Provider: Virtual Visit Location Provider: Mobile   I discussed the limitations of  evaluation and management by telemedicine and the availability of in person appointments. The patient expressed understanding and agreed to proceed.    History of Present Illness: Lindsay Burke is a 50 y.o. who identifies as a female who was assigned female at birth, and is being seen today for prolong URI.  HPI: Patient has been sick for 4 weeks. She has had cough and congestion and cough. Has occasional body aches. Had fever of 101 at beginning of illness. She went to urgent care 2 weeks ago and was given decadron and oral prednisone and doxycycline. She started feeling better. Finished antibiotics Tuesday. Over the last 48 hours she is feeling worse again. Has thick mucus with cough. Test for flu and ocvid and was negative.   Review of Systems  Constitutional:  Positive for malaise/fatigue. Negative for chills and fever.  HENT:  Positive for congestion. Negative for sore throat.   Respiratory:  Positive for cough and sputum production. Negative for shortness of breath.   Musculoskeletal:  Negative for myalgias.  Neurological:  Positive for headaches. Negative for dizziness.   Problems:  Patient Active Problem List   Diagnosis Date Noted   Asthma 08/22/2021   Palpitations 08/28/2020   Dyspnea due to COVID-19 12/21/2019   COVID-19 10/25/2019   Essential hypertension 09/05/2019   Malaise and fatigue 09/05/2019   Cardiac risk counseling 09/05/2019   Snores 09/05/2019   Mild intermittent asthma  with acute exacerbation 07/09/2018   Vitamin D deficiency 07/09/2018   Fibrocystic breast changes, left 11/09/2017   Hashimoto's disease 06/19/2017   Abnormal uterine bleeding 03/12/2016   History of endometrial ablation 03/12/2016   Menorrhagia with irregular cycle 03/12/2016   Thyroid disorder 03/12/2016   Postcoital bleeding 03/12/2016   Abnormality of left breast on screening mammogram 12/03/2015   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   Protrusion of intervertebral disc  of lumbosacral region 02/21/2015   Right-sided low back pain without sciatica 01/30/2015   SI (sacroiliac) joint dysfunction 01/30/2015   History of renal stone 03/08/2014   H/O knee surgery 03/08/2014   Hx of cholecystectomy 03/08/2014   Fatigue 01/18/2014   Multinodular goiter 01/18/2014    Allergies:  Allergies  Allergen Reactions   Acetaminophen Hypertension, Other (See Comments) and Rash    Tylenol arthritis puts into CHF    Clindamycin/Lincomycin Diarrhea   Phentermine Hypertension    Other reaction(s): Hypertension (intolerance) Also caused Tachycardia Also caused Tachycardia    Chlorthalidone Other (See Comments)    HTN HTN HTN    Nifedipine Other (See Comments) and Rash    Other reaction(s): Unknown Other reaction(s): Other (See Comments) High BP High BP High BP Other reaction(s): Unknown Other reaction(s): Other (See Comments) High BP High BP High BP Other reaction(s): Unknown Other reaction(s): Other (See Comments) High BP Other reaction(s): Unknown Other reaction(s): Other (See Comments) High BP High BP    Clonidine Other (See Comments)    NIGHTMARES, SLEEPY   Hydralazine Swelling    SOB   Spironolactone Hypertension   Aleve [Naproxen] Palpitations    HR 120   Medications:  Current Outpatient Medications:    acetaminophen (TYLENOL) 500 MG tablet, Take 1 tablet by mouth as needed., Disp: , Rfl:    albuterol (VENTOLIN HFA) 108 (90 Base) MCG/ACT inhaler, Inhale 2 puffs into the lungs every 4 (four) hours as needed., Disp: , Rfl:    carvedilol (COREG) 12.5 MG tablet, Take 2 tablets (25 mg total) by mouth at bedtime., Disp: 60 tablet, Rfl: 5   carvedilol (COREG) 12.5 MG tablet, Take 1 tablet (12.5 mg total) by mouth 2 (two) times daily., Disp: 60 tablet, Rfl: 5   cetirizine (ZYRTEC) 10 MG tablet, Take 10 mg by mouth daily. 0.5 TABLET AT BEDTIME, Disp: , Rfl:    Cholecalciferol (VITAMIN D3) 125 MCG (5000 UT) TABS, Take 1 tablet by mouth daily. , Disp:  , Rfl:    diclofenac Sodium (VOLTAREN) 1 % GEL, Apply 4 g topically 4 times daily for 30 days., Disp: 500 g, Rfl: 0   ezetimibe (ZETIA) 10 MG tablet, Take 1 tablet (10 mg total) by mouth daily., Disp: 90 tablet, Rfl: 3   fluticasone (FLONASE) 50 MCG/ACT nasal spray, SPRAY 1 SPRAY INTO EACH NOSTRIL EVERY DAY, Disp: , Rfl:    fluticasone (FLOVENT HFA) 110 MCG/ACT inhaler, Inhale 2 puffs into the lungs 2 (two) times daily., Disp: 1 Inhaler, Rfl: 0   hydrochlorothiazide (MICROZIDE) 12.5 MG capsule, Take 1 capsule (12.5 mg total) by mouth daily., Disp: 90 capsule, Rfl: 3   metroNIDAZOLE (METROCREAM) 0.75 % cream, Apply to the face on to the skin 2 times daily, Disp: 45 g, Rfl: 2   norethindrone (AYGESTIN) 5 MG tablet, TAKE 1 TABLET BY MOUTH ONCE DAILY, Disp: 30 tablet, Rfl: 0   norethindrone (AYGESTIN) 5 MG tablet, TAKE 1 TABLET BY MOUTH ONCE DAILY, Disp: 90 tablet, Rfl: 3   norethindrone (AYGESTIN) 5 MG tablet, TAKE  1 TABLET BY MOUTH ONCE DAILY, Disp: 90 tablet, Rfl: 3   promethazine-dextromethorphan (PROMETHAZINE-DM) 6.25-15 MG/5ML syrup, Take 5 mLs by mouth 4 (four) times a day as needed for cough for up to 7 days., Disp: 118 mL, Rfl: 0   Semaglutide-Weight Management (WEGOVY) 2.4 MG/0.75ML SOAJ, Inject 1 dose into the skin every 7 days, Disp: 3 mL, Rfl: 0   Semaglutide-Weight Management 2.4 MG/0.75ML SOAJ, Inject 1 dose into the skin every 7 days., Disp: 9 mL, Rfl: 0   telmisartan (MICARDIS) 80 MG tablet, Take 0.5 tablets (40 mg total) by mouth daily., Disp: 90 tablet, Rfl: 1   tretinoin (RETIN-A) 0.025 % cream, Apply a pea sized amount to the face at bedtime, Disp: 20 g, Rfl: 3  Observations/Objective: Patient is well-developed, well-nourished in no acute distress.  Resting comfortably  at home.  Head is normocephalic, atraumatic.  No labored breathing.  Speech is clear and coherent with logical content.  Patient is alert and oriented at baseline.  Raspy voice Dry cough  Assessment and  Plan:  Mahala Haughn in today with chief complaint of uri  1. Acute recurrent maxillary sinusitis 1. Take meds as prescribed 2. Use a cool mist humidifier especially during the winter months and when heat has been humid. 3. Use saline nose sprays frequently 4. Saline irrigations of the nose can be very helpful if Burke frequently.  * 4X daily for 1 week*  * Use of a nettie pot can be helpful with this. Follow directions with this* 5. Drink plenty of fluids 6. Keep thermostat turn down low 7.For any cough or congestion- mucinex d 8. For fever or aces or pains- take tylenol or ibuprofen appropriate for age and weight.  * for fevers greater than 101 orally you may alternate ibuprofen and tylenol every  3 hours.    - azithromycin (ZITHROMAX Z-PAK) 250 MG tablet; As directed  Dispense: 6 tablet; Refill: 0     Follow Up Instructions: I discussed the assessment and treatment plan with the patient. The patient was provided an opportunity to ask questions and all were answered. The patient agreed with the plan and demonstrated an understanding of the instructions.  A copy of instructions were sent to the patient via MyChart.  The patient was advised to call back or seek an in-person evaluation if the symptoms worsen or if the condition fails to improve as anticipated.  Time:  I spent 11 minutes with the patient via telehealth technology discussing the above problems/concerns.    Lindsay Hassell Done, FNP

## 2021-10-12 NOTE — Patient Instructions (Signed)
1. Take meds as prescribed 2. Use a cool mist humidifier especially during the winter months and when heat has been humid. 3. Use saline nose sprays frequently 4. Saline irrigations of the nose can be very helpful if done frequently.  * 4X daily for 1 week*  * Use of a nettie pot can be helpful with this. Follow directions with this* 5. Drink plenty of fluids 6. Keep thermostat turn down low 7.For any cough or congestion- mucinex d 8. For fever or aces or pains- take tylenol or ibuprofen appropriate for age and weight.  * for fevers greater than 101 orally you may alternate ibuprofen and tylenol every  3 hours.    

## 2021-10-14 ENCOUNTER — Other Ambulatory Visit (HOSPITAL_BASED_OUTPATIENT_CLINIC_OR_DEPARTMENT_OTHER): Payer: Self-pay

## 2021-10-17 ENCOUNTER — Ambulatory Visit: Payer: 59 | Admitting: Cardiology

## 2021-10-23 ENCOUNTER — Other Ambulatory Visit (HOSPITAL_BASED_OUTPATIENT_CLINIC_OR_DEPARTMENT_OTHER): Payer: Self-pay

## 2021-11-11 ENCOUNTER — Other Ambulatory Visit (HOSPITAL_BASED_OUTPATIENT_CLINIC_OR_DEPARTMENT_OTHER): Payer: Self-pay

## 2021-11-11 ENCOUNTER — Other Ambulatory Visit: Payer: Self-pay | Admitting: Pharmacist

## 2021-11-11 MED ORDER — TELMISARTAN 40 MG PO TABS
40.0000 mg | ORAL_TABLET | Freq: Every day | ORAL | 3 refills | Status: DC
  Filled 2021-11-11: qty 90, 90d supply, fill #0
  Filled 2022-02-05: qty 90, 90d supply, fill #1
  Filled 2022-05-07: qty 90, 90d supply, fill #2
  Filled 2022-08-04: qty 90, 90d supply, fill #3

## 2021-11-15 DIAGNOSIS — K648 Other hemorrhoids: Secondary | ICD-10-CM | POA: Diagnosis not present

## 2021-11-15 DIAGNOSIS — Z1211 Encounter for screening for malignant neoplasm of colon: Secondary | ICD-10-CM | POA: Diagnosis not present

## 2021-11-22 ENCOUNTER — Other Ambulatory Visit (HOSPITAL_BASED_OUTPATIENT_CLINIC_OR_DEPARTMENT_OTHER): Payer: Self-pay

## 2021-11-22 DIAGNOSIS — N3001 Acute cystitis with hematuria: Secondary | ICD-10-CM | POA: Diagnosis not present

## 2021-11-22 DIAGNOSIS — R300 Dysuria: Secondary | ICD-10-CM | POA: Diagnosis not present

## 2021-11-22 MED ORDER — CEPHALEXIN 500 MG PO CAPS
ORAL_CAPSULE | ORAL | 0 refills | Status: DC
  Filled 2021-11-22: qty 14, 7d supply, fill #0

## 2021-11-25 ENCOUNTER — Other Ambulatory Visit (HOSPITAL_BASED_OUTPATIENT_CLINIC_OR_DEPARTMENT_OTHER): Payer: Self-pay

## 2021-11-25 ENCOUNTER — Telehealth: Payer: 59 | Admitting: Nurse Practitioner

## 2021-11-25 DIAGNOSIS — N3001 Acute cystitis with hematuria: Secondary | ICD-10-CM | POA: Diagnosis not present

## 2021-11-25 MED ORDER — CIPROFLOXACIN HCL 500 MG PO TABS
500.0000 mg | ORAL_TABLET | Freq: Two times a day (BID) | ORAL | 0 refills | Status: AC
Start: 2021-11-25 — End: 2021-11-28
  Filled 2021-11-25: qty 6, 3d supply, fill #0

## 2021-11-25 MED ORDER — SULFAMETHOXAZOLE-TRIMETHOPRIM 800-160 MG PO TABS
1.0000 | ORAL_TABLET | Freq: Two times a day (BID) | ORAL | 0 refills | Status: DC
Start: 2021-11-25 — End: 2021-11-25
  Filled 2021-11-25: qty 10, 5d supply, fill #0

## 2021-11-25 NOTE — Addendum Note (Signed)
Addended by: Madilyn Hook on: 11/25/2021 04:57 PM   Modules accepted: Orders

## 2021-11-25 NOTE — Progress Notes (Signed)
Repeat visit

## 2021-11-25 NOTE — Progress Notes (Addendum)
Virtual Visit Consent   Lindsay Burke, you are scheduled for a virtual visit with a [facility] provider today.     Just as with appointments in the office, your consent must be obtained to participate.  Your consent will be active for this visit and any virtual visit you may have with one of our providers in the next 365 days.     If you have a MyChart account, a copy of this consent can be sent to you electronically.  All virtual visits are billed to your insurance company just like a traditional visit in the office.    As this is a virtual visit, video technology does not allow for your provider to perform a traditional examination.  This may limit your provider's ability to fully assess your condition.  If your provider identifies any concerns that need to be evaluated in person or the need to arrange testing (such as labs, EKG, etc.), we will make arrangements to do so.     Although advances in technology are sophisticated, we cannot ensure that it will always work on either your end or our end.  If the connection with a video visit is poor, the visit may have to be switched to a telephone visit.  With either a video or telephone visit, we are not always able to ensure that we have a secure connection.     I need to obtain your verbal consent now.   Are you willing to proceed with your visit today?    Buford Pape has provided verbal consent on 11/25/2021 for a virtual visit (video or telephone).   Apolonio Schneiders, FNP   Date: 11/25/2021 4:21 PM   Virtual Visit via Video Note   I, Apolonio Schneiders, connected with  Lindsay Burke  ([id number], [birth year]) on 11/25/21 at  4:30 PM EST by a video-enabled telemedicine application and verified that I am speaking with the correct person using two identifiers.  Location: Patient: Virtual Visit Location Patient: Home Provider: Virtual Visit Location Provider: Home Office   I discussed the limitations of evaluation and management by telemedicine and the  availability of in person appointments. The patient expressed understanding and agreed to proceed.    History of Present Illness: Lindsay Burke is a 51 y.o. who identifies as a female who was assigned female at birth, and is being seen today for UTI follow up.   6 days ago she started to have some body aches and urgency to urinate with discomfort. She was also going through a Colon prep at the time for a colonoscopy.   By Friday (3 days ago) she went to the [facility] for UTI treatment. She was started on Keflex - had + leukocytes and hematuria. Culture has not resulted.   She has been on Keflex for 3.5 days now.   Symptoms of UTI have now resolved, she did end up having a fever for the first 48 hours after starting the antibiotic. That has been resolved for over 24 hours.   This morning she started to notice a rash on her back that has now spread to both sides of her torso. She is itching as well. She has developed multiple drug sensitivities over the past few years.   She received her culture results and Cipro was listed as sensitive- she has tolerated that well in the past.   Problems:  Patient Active Problem List   Diagnosis Date Noted   Asthma 08/22/2021   Palpitations 08/28/2020   Dyspnea due  to COVID-19 12/21/2019   COVID-19 10/25/2019   Essential hypertension 09/05/2019   Malaise and fatigue 09/05/2019   Cardiac risk counseling 09/05/2019   Snores 09/05/2019   Mild intermittent asthma with acute exacerbation 07/09/2018   Vitamin D deficiency 07/09/2018   Fibrocystic breast changes, left 11/09/2017   Hashimoto's disease 06/19/2017   Abnormal uterine bleeding 03/12/2016   History of endometrial ablation 03/12/2016   Menorrhagia with irregular cycle 03/12/2016   Thyroid disorder 03/12/2016   Postcoital bleeding 03/12/2016   Abnormality of left breast on screening mammogram 12/03/2015   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   Protrusion of intervertebral disc  of lumbosacral region 02/21/2015   Right-sided low back pain without sciatica 01/30/2015   SI (sacroiliac) joint dysfunction 01/30/2015   History of renal stone 03/08/2014   H/O knee surgery 03/08/2014   Hx of cholecystectomy 03/08/2014   Fatigue 01/18/2014   Multinodular goiter 01/18/2014    Allergies:  Allergies  Allergen Reactions   Acetaminophen Hypertension, Other (See Comments) and Rash    Tylenol arthritis puts into CHF    Clindamycin/Lincomycin Diarrhea   Phentermine Hypertension    Other reaction(s): Hypertension (intolerance) Also caused Tachycardia Also caused Tachycardia    Chlorthalidone Other (See Comments)    HTN HTN HTN    Nifedipine Other (See Comments) and Rash    Other reaction(s): Unknown Other reaction(s): Other (See Comments) High BP High BP High BP Other reaction(s): Unknown Other reaction(s): Other (See Comments) High BP High BP High BP Other reaction(s): Unknown Other reaction(s): Other (See Comments) High BP Other reaction(s): Unknown Other reaction(s): Other (See Comments) High BP High BP    Clonidine Other (See Comments)    NIGHTMARES, SLEEPY   Hydralazine Swelling    SOB   Spironolactone Hypertension   Aleve [Naproxen] Palpitations    HR 120   Medications:  Current Outpatient Medications:    acetaminophen (TYLENOL) 500 MG tablet, Take 1 tablet by mouth as needed., Disp: , Rfl:    albuterol (VENTOLIN HFA) 108 (90 Base) MCG/ACT inhaler, Inhale 2 puffs into the lungs every 4 (four) hours as needed., Disp: , Rfl:    azithromycin (ZITHROMAX Z-PAK) 250 MG tablet, As directed, Disp: 6 tablet, Rfl: 0   carvedilol (COREG) 12.5 MG tablet, Take 2 tablets (25 mg total) by mouth at bedtime., Disp: 60 tablet, Rfl: 5   carvedilol (COREG) 12.5 MG tablet, Take 1 tablet (12.5 mg total) by mouth 2 (two) times daily., Disp: 60 tablet, Rfl: 5   cephALEXin (KEFLEX) 500 MG capsule, Take one capsule (500 mg dose) by mouth 2 (two) times daily for 7  days., Disp: 14 capsule, Rfl: 0   cetirizine (ZYRTEC) 10 MG tablet, Take 10 mg by mouth daily. 0.5 TABLET AT BEDTIME, Disp: , Rfl:    Cholecalciferol (VITAMIN D3) 125 MCG (5000 UT) TABS, Take 1 tablet by mouth daily. , Disp: , Rfl:    diclofenac Sodium (VOLTAREN) 1 % GEL, Apply 4 g topically 4 times daily for 30 days., Disp: 500 g, Rfl: 0   ezetimibe (ZETIA) 10 MG tablet, Take 1 tablet (10 mg total) by mouth daily., Disp: 90 tablet, Rfl: 3   fluticasone (FLONASE) 50 MCG/ACT nasal spray, SPRAY 1 SPRAY INTO EACH NOSTRIL EVERY DAY, Disp: , Rfl:    fluticasone (FLOVENT HFA) 110 MCG/ACT inhaler, Inhale 2 puffs into the lungs 2 (two) times daily., Disp: 1 Inhaler, Rfl: 0   hydrochlorothiazide (MICROZIDE) 12.5 MG capsule, Take 1 capsule (12.5 mg total) by mouth  daily., Disp: 90 capsule, Rfl: 3   metroNIDAZOLE (METROCREAM) 0.75 % cream, Apply to the face on to the skin 2 times daily, Disp: 45 g, Rfl: 2   norethindrone (AYGESTIN) 5 MG tablet, TAKE 1 TABLET BY MOUTH ONCE DAILY, Disp: 30 tablet, Rfl: 0   norethindrone (AYGESTIN) 5 MG tablet, TAKE 1 TABLET BY MOUTH ONCE DAILY, Disp: 90 tablet, Rfl: 3   norethindrone (AYGESTIN) 5 MG tablet, TAKE 1 TABLET BY MOUTH ONCE DAILY, Disp: 90 tablet, Rfl: 3   promethazine-dextromethorphan (PROMETHAZINE-DM) 6.25-15 MG/5ML syrup, Take 5 mLs by mouth 4 (four) times a day as needed for cough for up to 7 days., Disp: 118 mL, Rfl: 0   Semaglutide-Weight Management (WEGOVY) 2.4 MG/0.75ML SOAJ, Inject 1 dose into the skin every 7 days, Disp: 3 mL, Rfl: 0   Semaglutide-Weight Management 2.4 MG/0.75ML SOAJ, Inject 1 dose into the skin every 7 days., Disp: 9 mL, Rfl: 0   telmisartan (MICARDIS) 40 MG tablet, Take 1 tablet (40 mg total) by mouth daily., Disp: 90 tablet, Rfl: 3   tretinoin (RETIN-A) 0.025 % cream, Apply a pea sized amount to the face at bedtime, Disp: 20 g, Rfl: 3  Observations/Objective: Patient is well-developed, well-nourished in no acute distress.  Resting  comfortably at home.  Head is normocephalic, atraumatic.  No labored breathing.  Speech is clear and coherent with logical content.  Patient is alert and oriented at baseline.    Assessment and Plan: 1. Acute cystitis with hematuria Meds ordered this encounter  Medications   DISCONTD: sulfamethoxazole-trimethoprim (BACTRIM DS) 800-160 MG tablet    Sig: Take 1 tablet by mouth 2 (two) times daily for 5 days.    Dispense:  10 tablet    Refill:  0   ciprofloxacin (CIPRO) 500 MG tablet    Sig: Take 1 tablet (500 mg total) by mouth 2 (two) times daily for 3 days.    Dispense:  6 tablet    Refill:  0   * patient called after visit and she was able to view urine culture through Mychart confirming Resistance to Bactrim, sensitivity to Cipro  Educated on risks with flouroquinolones- patient aware   Will follow up with any return of symptoms or new concerns      Stop Keflex  Continue Zyrtec   Follow Up Instructions: I discussed the assessment and treatment plan with the patient. The patient was provided an opportunity to ask questions and all were answered. The patient agreed with the plan and demonstrated an understanding of the instructions.  A copy of instructions were sent to the patient via MyChart unless otherwise noted below.    The patient was advised to call back or seek an in-person evaluation if the symptoms worsen or if the condition fails to improve as anticipated.  Time:  I spent 15 minutes with the patient via telehealth technology discussing the above problems/concerns.    Apolonio Schneiders, FNP

## 2021-11-26 ENCOUNTER — Other Ambulatory Visit (HOSPITAL_BASED_OUTPATIENT_CLINIC_OR_DEPARTMENT_OTHER): Payer: Self-pay

## 2021-11-26 MED ORDER — NITROFURANTOIN MONOHYD MACRO 100 MG PO CAPS
ORAL_CAPSULE | ORAL | 0 refills | Status: DC
  Filled 2021-11-26: qty 14, 7d supply, fill #0

## 2021-12-05 ENCOUNTER — Other Ambulatory Visit (HOSPITAL_BASED_OUTPATIENT_CLINIC_OR_DEPARTMENT_OTHER): Payer: Self-pay

## 2021-12-23 ENCOUNTER — Other Ambulatory Visit (HOSPITAL_BASED_OUTPATIENT_CLINIC_OR_DEPARTMENT_OTHER): Payer: Self-pay

## 2021-12-26 ENCOUNTER — Ambulatory Visit: Payer: 59 | Admitting: Cardiology

## 2022-01-03 DIAGNOSIS — M25511 Pain in right shoulder: Secondary | ICD-10-CM | POA: Diagnosis not present

## 2022-01-03 DIAGNOSIS — M19011 Primary osteoarthritis, right shoulder: Secondary | ICD-10-CM | POA: Diagnosis not present

## 2022-01-03 DIAGNOSIS — M7581 Other shoulder lesions, right shoulder: Secondary | ICD-10-CM | POA: Diagnosis not present

## 2022-01-08 ENCOUNTER — Other Ambulatory Visit (HOSPITAL_BASED_OUTPATIENT_CLINIC_OR_DEPARTMENT_OTHER): Payer: Self-pay

## 2022-01-17 DIAGNOSIS — M2419 Other articular cartilage disorders, other specified site: Secondary | ICD-10-CM | POA: Diagnosis not present

## 2022-01-17 DIAGNOSIS — M1711 Unilateral primary osteoarthritis, right knee: Secondary | ICD-10-CM | POA: Diagnosis not present

## 2022-01-17 DIAGNOSIS — M25561 Pain in right knee: Secondary | ICD-10-CM | POA: Diagnosis not present

## 2022-01-29 ENCOUNTER — Other Ambulatory Visit (HOSPITAL_BASED_OUTPATIENT_CLINIC_OR_DEPARTMENT_OTHER): Payer: Self-pay

## 2022-01-29 DIAGNOSIS — R5383 Other fatigue: Secondary | ICD-10-CM | POA: Diagnosis not present

## 2022-01-29 DIAGNOSIS — E663 Overweight: Secondary | ICD-10-CM | POA: Diagnosis not present

## 2022-01-29 DIAGNOSIS — E785 Hyperlipidemia, unspecified: Secondary | ICD-10-CM | POA: Diagnosis not present

## 2022-01-29 DIAGNOSIS — E559 Vitamin D deficiency, unspecified: Secondary | ICD-10-CM | POA: Diagnosis not present

## 2022-01-29 DIAGNOSIS — Z7689 Persons encountering health services in other specified circumstances: Secondary | ICD-10-CM | POA: Diagnosis not present

## 2022-01-29 DIAGNOSIS — E042 Nontoxic multinodular goiter: Secondary | ICD-10-CM | POA: Diagnosis not present

## 2022-01-29 MED ORDER — WEGOVY 0.25 MG/0.5ML ~~LOC~~ SOAJ
SUBCUTANEOUS | 0 refills | Status: DC
  Filled 2022-01-29 – 2022-09-17 (×2): qty 2, 28d supply, fill #0

## 2022-01-29 MED ORDER — WEGOVY 0.5 MG/0.5ML ~~LOC~~ SOAJ
0.5000 mg | SUBCUTANEOUS | 0 refills | Status: DC
  Filled 2022-01-29 – 2022-09-19 (×4): qty 2, 28d supply, fill #0

## 2022-01-29 MED ORDER — WEGOVY 1.7 MG/0.75ML ~~LOC~~ SOAJ
1.7000 mg | SUBCUTANEOUS | 0 refills | Status: DC
  Filled 2022-01-29 – 2022-11-25 (×3): qty 3, 28d supply, fill #0

## 2022-01-29 MED ORDER — WEGOVY 2.4 MG/0.75ML ~~LOC~~ SOAJ
2.4000 mg | SUBCUTANEOUS | 1 refills | Status: DC
  Filled 2022-01-29 – 2022-12-10 (×3): qty 3, 28d supply, fill #0

## 2022-01-29 MED ORDER — WEGOVY 1 MG/0.5ML ~~LOC~~ SOAJ
SUBCUTANEOUS | 0 refills | Status: DC
  Filled 2022-01-29 – 2022-11-11 (×3): qty 2, 28d supply, fill #0

## 2022-01-30 ENCOUNTER — Other Ambulatory Visit (HOSPITAL_BASED_OUTPATIENT_CLINIC_OR_DEPARTMENT_OTHER): Payer: Self-pay

## 2022-01-31 ENCOUNTER — Other Ambulatory Visit (HOSPITAL_BASED_OUTPATIENT_CLINIC_OR_DEPARTMENT_OTHER): Payer: Self-pay

## 2022-02-03 ENCOUNTER — Other Ambulatory Visit (HOSPITAL_BASED_OUTPATIENT_CLINIC_OR_DEPARTMENT_OTHER): Payer: Self-pay

## 2022-02-03 ENCOUNTER — Telehealth: Payer: Self-pay | Admitting: Pharmacist

## 2022-02-03 NOTE — Telephone Encounter (Signed)
Patient called to report cramps on Zetia after 3 months. She stopped the medication and they are resolving. Patient does not want to take statin due to concerns with muscle aches.She is requesting Repatha. ?I did explain that we might have a hard time getting Repatha approved if she has not take a statin.  ?PA submitted. ?

## 2022-02-04 ENCOUNTER — Other Ambulatory Visit (HOSPITAL_BASED_OUTPATIENT_CLINIC_OR_DEPARTMENT_OTHER): Payer: Self-pay

## 2022-02-05 ENCOUNTER — Other Ambulatory Visit (HOSPITAL_BASED_OUTPATIENT_CLINIC_OR_DEPARTMENT_OTHER): Payer: Self-pay

## 2022-02-06 ENCOUNTER — Other Ambulatory Visit (HOSPITAL_BASED_OUTPATIENT_CLINIC_OR_DEPARTMENT_OTHER): Payer: Self-pay

## 2022-02-06 DIAGNOSIS — H6123 Impacted cerumen, bilateral: Secondary | ICD-10-CM | POA: Diagnosis not present

## 2022-02-07 ENCOUNTER — Other Ambulatory Visit (HOSPITAL_BASED_OUTPATIENT_CLINIC_OR_DEPARTMENT_OTHER): Payer: Self-pay

## 2022-02-09 NOTE — Progress Notes (Signed)
?Cardiology Office Note:   ? ?Date:  02/10/2022  ? ?ID:  Lindsay Burke, DOB [birth year], MRN [id number] ? ?PCP:  Valinda Hoar, PA-C  ?Cardiologist:  Shirlee More, MD   ? ?Referring MD: Valinda Hoar,*  ? ? ?ASSESSMENT:   ? ?1. Pure hypercholesterolemia   ?2. Essential hypertension   ?3. Mitral valve prolapse   ? ?PLAN:   ? ?In order of problems listed above: ? ?Despite a calcium score of 0 she remains at increased cardiovascular risk with lipid disorder hypertension family history wants to be on treatment intolerant of Zetia unwilling to try statins because of chronic muscle symptoms and we will see if we get her preapproved for a PCSK9 inhibitor.  Her insurance has declined PCSK9 inhibitor, for now watchful waiting and hold on lipid-lowering treatment ?Stable continue current treatment ambulatory blood pressures are in range ?Stable ?I told her I would repeat a coronary calcium score with her family history November 2025 ? ? ?Next appointment: 1 year ? ? ?Medication Adjustments/Labs and Tests Ordered: ?Current medicines are reviewed at length with the patient today.  Concerns regarding medicines are outlined above.  ?No orders of the defined types were placed in this encounter. ? ?No orders of the defined types were placed in this encounter. ? ? ?Chief complaint: I was unable to tolerate Zetia severe muscle pain and cramping that persisted for 1 month after stopping ?I am hesitant and will not take a statin because of muscle symptoms ? ?History of Present Illness:   ? ?Lindsay Burke is a 51 y.o. female with a hx of hypertension mitral valve prolapse asthma and COVID-19 infection. Her coronary calcium score is zero  last seen 08/28/2020.Echocardiogram 12/27/2019 showed normal left ventricular size systolic and diastolic function the right ventricle is also normal and there was no significant valvular abnormality.  ? ?Compliance with diet, lifestyle and medications: Yes ? ?Unfortunately Zetia was not  tolerated. ?We reviewed options including cholestyramine bempedoic acid or PCSK9 inhibitors ?She would like to try Repatha ?No angina edema shortness of breath chest pain palpitation or syncope ?Past Medical History:  ?Diagnosis Date  ? Asthma   ? Hypertension   ? Thyroid disease   ? ? ?Past Surgical History:  ?Procedure Laterality Date  ? CHOLECYSTECTOMY    ? ? ?Current Medications: ?Current Meds  ?Medication Sig  ? acetaminophen (TYLENOL) 500 MG tablet Take 1 tablet by mouth every 4 (four) hours as needed for mild pain or fever.  ? albuterol (VENTOLIN HFA) 108 (90 Base) MCG/ACT inhaler Inhale 2 puffs into the lungs every 4 (four) hours as needed for wheezing or shortness of breath.  ? carvedilol (COREG) 12.5 MG tablet Take 2 tablets (25 mg total) by mouth at bedtime.  ? cetirizine (ZYRTEC) 10 MG tablet Take 10 mg by mouth daily. 0.5 TABLET AT BEDTIME  ? Cholecalciferol (VITAMIN D3) 125 MCG (5000 UT) TABS Take 1 tablet by mouth daily.   ? Cyanocobalamin (B-12) 500 MCG SUBL Place 1 tablet under the tongue daily at 12 noon.  ? diclofenac Sodium (VOLTAREN) 1 % GEL Apply 4 g topically 4 times daily for 30 days.  ? fluticasone (FLONASE) 50 MCG/ACT nasal spray SPRAY 1 SPRAY INTO EACH NOSTRIL EVERY DAY  ? fluticasone (FLOVENT HFA) 110 MCG/ACT inhaler Inhale 2 puffs into the lungs 2 (two) times daily.  ? hydrochlorothiazide (MICROZIDE) 12.5 MG capsule Take 1 capsule (12.5 mg total) by mouth daily.  ? MAGNESIUM OXIDE 400 PO Take 1 tablet by mouth  at bedtime.  ? metroNIDAZOLE (METROCREAM) 0.75 % cream Apply to the face on to the skin 2 times daily  ? norethindrone (AYGESTIN) 5 MG tablet TAKE 1 TABLET BY MOUTH ONCE DAILY  ? telmisartan (MICARDIS) 40 MG tablet Take 1 tablet (40 mg total) by mouth daily.  ? tretinoin (RETIN-A) 0.025 % cream Apply a pea sized amount to the face at bedtime  ?  ? ?Allergies:   Acetaminophen, Clindamycin/lincomycin, Hydralazine, Phentermine, Chlorthalidone, Nifedipine, Clonidine, Spironolactone,  Zetia [ezetimibe], Aleve [naproxen], and Cephalexin  ? ?Social History  ? ?Socioeconomic History  ? Marital status: Married  ?  Spouse name: Not on file  ? Number of children: Not on file  ? Years of education: Not on file  ? Highest education level: Not on file  ?Occupational History  ? Not on file  ?Tobacco Use  ? Smoking status: Never  ?  Passive exposure: Never  ? Smokeless tobacco: Never  ?Vaping Use  ? Vaping Use: Never used  ?Substance and Sexual Activity  ? Alcohol use: Never  ? Drug use: Never  ? Sexual activity: Not on file  ?  Comment: progestrone  ?Other Topics Concern  ? Not on file  ?Social History Narrative  ? Not on file  ? ?Social Determinants of Health  ? ?Financial Resource Strain: Not on file  ?Food Insecurity: Not on file  ?Transportation Needs: Not on file  ?Physical Activity: Not on file  ?Stress: Not on file  ?Social Connections: Not on file  ?  ? ?Family History: ?The patient's family history includes Heart attack in her father; Hypertension in her father and mother. ?ROS:   ?Please see the history of present illness.    ?All other systems reviewed and are negative. ? ?EKGs/Labs/Other Studies Reviewed:   ? ?The following studies were reviewed today: ? ?09/24/2020: ?Study Highlights ?A ZIO monitor was performed for 2 days 21 hours beginning 08/28/2020. ?Cardiac rhythm throughout with sinus with minimum, average and maximum heart rates of 55, 7621 bpm. ?There were no pauses of 3 seconds or greater no episodes of second or third-degree AV nodal block or sinus node exit block. ?There were 16 triggered and 3 diary events predominantly associated with frequent PVCs and trigeminy at times. ?Ventricular ectopy was rare with  less than 1% PVCs. ?Supraventricular ectopy was also rare with isolated APCs and no episodes of atrial fibrillation flutter or SVT. ?Conclusion: Rare ventricular and supraventricular ectopy although triggered and diary events predominantly associated with PVCs ? ?EKG:  EKG  ordered today and personally reviewed.  The ekg ordered today demonstrates sinus rhythm and remains normal ? ?Recent Labs: ?Her most recent lipid profile performed at [facility] on Zetia cholesterol 200 HDL 54 LDL 128 ?Recent Lipid Panel ?   ?Component Value Date/Time  ? CHOL 202 (H) 08/28/2020 0955  ? TRIG 115 08/28/2020 0955  ? HDL 35 (L) 08/28/2020 0955  ? CHOLHDL 5.8 (H) 08/28/2020 0955  ? [place] 146 (H) 08/28/2020 0955  ? ? ?Physical Exam:   ? ?VS:  BP (!) 148/88 (BP Location: Right Arm)   Pulse 60   Ht '5\' 4"'$  (1.626 m)   Wt 151 lb (68.5 kg)   SpO2 98%   BMI 25.92 kg/m?    ? ?Wt Readings from Last 3 Encounters:  ?02/10/22 151 lb (68.5 kg)  ?08/28/20 162 lb (73.5 kg)  ?12/21/19 167 lb (75.8 kg)  ?  ? ?GEN:  Well nourished, well developed in no acute distress ?HEENT: Normal ?NECK: No  JVD; No carotid bruits ?LYMPHATICS: No lymphadenopathy ?CARDIAC: RRR, no murmurs, rubs, gallops ?RESPIRATORY:  Clear to auscultation without rales, wheezing or rhonchi  ?ABDOMEN: Soft, non-tender, non-distended ?MUSCULOSKELETAL:  No edema; No deformity  ?SKIN: Warm and dry ?NEUROLOGIC:  Alert and oriented x 3 ?PSYCHIATRIC:  Normal affect  ? ? ?Signed, ?Shirlee More, MD  ?02/10/2022 9:07 AM    ?[place]  ?

## 2022-02-10 ENCOUNTER — Other Ambulatory Visit (HOSPITAL_BASED_OUTPATIENT_CLINIC_OR_DEPARTMENT_OTHER): Payer: Self-pay

## 2022-02-10 ENCOUNTER — Ambulatory Visit: Payer: 59 | Admitting: Cardiology

## 2022-02-10 ENCOUNTER — Encounter: Payer: Self-pay | Admitting: Cardiology

## 2022-02-10 VITALS — BP 148/88 | HR 60 | Ht 64.0 in | Wt 151.0 lb

## 2022-02-10 DIAGNOSIS — I10 Essential (primary) hypertension: Secondary | ICD-10-CM | POA: Diagnosis not present

## 2022-02-10 DIAGNOSIS — E7800 Pure hypercholesterolemia, unspecified: Secondary | ICD-10-CM

## 2022-02-10 DIAGNOSIS — N3000 Acute cystitis without hematuria: Secondary | ICD-10-CM | POA: Diagnosis not present

## 2022-02-10 DIAGNOSIS — I341 Nonrheumatic mitral (valve) prolapse: Secondary | ICD-10-CM | POA: Diagnosis not present

## 2022-02-10 MED ORDER — REPATHA SURECLICK 140 MG/ML ~~LOC~~ SOAJ
140.0000 mg | SUBCUTANEOUS | 3 refills | Status: DC
  Filled 2022-02-10: qty 2, 28d supply, fill #0

## 2022-02-10 NOTE — Telephone Encounter (Signed)
PA for Repatha denied due to pt not trying a statin. I discussed with patient. She is afraid of muscle cramps. We talked about the results of the SAMSON trial and the N of 1 trial. Recommended she try low dose rosuvastatin '5mg'$  MWF. She will discuss with Dr. Bettina Gavia. I advised she wait about 2 weeks before starting since her muscle cramps just improved.  ?

## 2022-02-10 NOTE — Patient Instructions (Addendum)

## 2022-02-11 ENCOUNTER — Other Ambulatory Visit (HOSPITAL_BASED_OUTPATIENT_CLINIC_OR_DEPARTMENT_OTHER): Payer: Self-pay

## 2022-02-12 ENCOUNTER — Other Ambulatory Visit (HOSPITAL_BASED_OUTPATIENT_CLINIC_OR_DEPARTMENT_OTHER): Payer: Self-pay

## 2022-02-13 ENCOUNTER — Other Ambulatory Visit (HOSPITAL_BASED_OUTPATIENT_CLINIC_OR_DEPARTMENT_OTHER): Payer: Self-pay

## 2022-02-14 ENCOUNTER — Other Ambulatory Visit (HOSPITAL_BASED_OUTPATIENT_CLINIC_OR_DEPARTMENT_OTHER): Payer: Self-pay

## 2022-02-25 ENCOUNTER — Encounter: Payer: Self-pay | Admitting: Cardiology

## 2022-02-25 ENCOUNTER — Telehealth: Payer: Self-pay

## 2022-02-25 DIAGNOSIS — I341 Nonrheumatic mitral (valve) prolapse: Secondary | ICD-10-CM

## 2022-02-25 NOTE — Telephone Encounter (Signed)
Replied to pt regarding echocardiogram order. Order submitted per Dr. Joya Gaskins order.  ?

## 2022-03-14 ENCOUNTER — Ambulatory Visit (HOSPITAL_BASED_OUTPATIENT_CLINIC_OR_DEPARTMENT_OTHER)
Admission: RE | Admit: 2022-03-14 | Discharge: 2022-03-14 | Disposition: A | Discharge status: Home / Self Care | Payer: 59 | Source: Ambulatory Visit | Attending: Cardiology | Admitting: Cardiology

## 2022-03-14 ENCOUNTER — Encounter: Payer: Self-pay | Admitting: Cardiology

## 2022-03-14 DIAGNOSIS — I341 Nonrheumatic mitral (valve) prolapse: Secondary | ICD-10-CM | POA: Diagnosis not present

## 2022-03-14 LAB — ECHOCARDIOGRAM COMPLETE
AR max vel: 2.84 cm2
AV Area VTI: 2.52 cm2
AV Area mean vel: 2.56 cm2
AV Mean grad: 5 mmHg
AV Peak grad: 9.1 mmHg
Ao pk vel: 1.51 m/s
S' Lateral: 2.8 cm

## 2022-03-14 NOTE — Progress Notes (Incomplete)
  Echocardiogram 2D Echocardiogram has been performed.  Lindsay Burke F 03/14/2022, 5:00 PM

## 2022-03-18 ENCOUNTER — Telehealth: Payer: Self-pay

## 2022-03-18 ENCOUNTER — Other Ambulatory Visit: Payer: Self-pay

## 2022-03-18 DIAGNOSIS — I341 Nonrheumatic mitral (valve) prolapse: Secondary | ICD-10-CM

## 2022-03-18 NOTE — Telephone Encounter (Signed)
Patient had a question as to how often Dr. Bettina Gavia wanted her to have an echocardiogram. Sent Dr. Bettina Gavia a message to find out and he replied that he would like the patient to have an echocardiogram every two years. Sent message to have her echocardiogram added to the recall list.

## 2022-03-19 ENCOUNTER — Other Ambulatory Visit (HOSPITAL_BASED_OUTPATIENT_CLINIC_OR_DEPARTMENT_OTHER): Payer: Self-pay

## 2022-03-25 ENCOUNTER — Other Ambulatory Visit: Payer: Self-pay | Admitting: Cardiology

## 2022-03-25 ENCOUNTER — Other Ambulatory Visit (HOSPITAL_BASED_OUTPATIENT_CLINIC_OR_DEPARTMENT_OTHER): Payer: Self-pay

## 2022-03-25 MED ORDER — CARVEDILOL 12.5 MG PO TABS
25.0000 mg | ORAL_TABLET | Freq: Every day | ORAL | 5 refills | Status: DC
  Filled 2022-03-25: qty 60, 30d supply, fill #0
  Filled 2022-04-20: qty 60, 30d supply, fill #1
  Filled 2022-05-22: qty 60, 30d supply, fill #2
  Filled 2022-06-19: qty 60, 30d supply, fill #3
  Filled 2022-07-20: qty 14, 7d supply, fill #4
  Filled 2022-07-21: qty 46, 23d supply, fill #4
  Filled 2022-08-18: qty 60, 30d supply, fill #5

## 2022-03-25 NOTE — Telephone Encounter (Signed)
Rx refill sent to pharmacy. 

## 2022-04-17 ENCOUNTER — Other Ambulatory Visit (HOSPITAL_BASED_OUTPATIENT_CLINIC_OR_DEPARTMENT_OTHER): Payer: Self-pay

## 2022-04-21 ENCOUNTER — Other Ambulatory Visit (HOSPITAL_BASED_OUTPATIENT_CLINIC_OR_DEPARTMENT_OTHER): Payer: Self-pay

## 2022-05-06 ENCOUNTER — Other Ambulatory Visit (HOSPITAL_BASED_OUTPATIENT_CLINIC_OR_DEPARTMENT_OTHER): Payer: Self-pay

## 2022-05-07 ENCOUNTER — Other Ambulatory Visit (HOSPITAL_BASED_OUTPATIENT_CLINIC_OR_DEPARTMENT_OTHER): Payer: Self-pay

## 2022-05-15 ENCOUNTER — Other Ambulatory Visit (HOSPITAL_BASED_OUTPATIENT_CLINIC_OR_DEPARTMENT_OTHER): Payer: Self-pay

## 2022-05-22 ENCOUNTER — Other Ambulatory Visit (HOSPITAL_BASED_OUTPATIENT_CLINIC_OR_DEPARTMENT_OTHER): Payer: Self-pay

## 2022-05-30 ENCOUNTER — Other Ambulatory Visit (HOSPITAL_BASED_OUTPATIENT_CLINIC_OR_DEPARTMENT_OTHER): Payer: Self-pay | Admitting: Obstetrics and Gynecology

## 2022-05-30 DIAGNOSIS — Z1231 Encounter for screening mammogram for malignant neoplasm of breast: Secondary | ICD-10-CM

## 2022-06-05 DIAGNOSIS — M1711 Unilateral primary osteoarthritis, right knee: Secondary | ICD-10-CM | POA: Diagnosis not present

## 2022-06-05 DIAGNOSIS — M11261 Other chondrocalcinosis, right knee: Secondary | ICD-10-CM | POA: Diagnosis not present

## 2022-06-20 ENCOUNTER — Other Ambulatory Visit (HOSPITAL_BASED_OUTPATIENT_CLINIC_OR_DEPARTMENT_OTHER): Payer: Self-pay

## 2022-06-25 ENCOUNTER — Ambulatory Visit (HOSPITAL_BASED_OUTPATIENT_CLINIC_OR_DEPARTMENT_OTHER): Payer: 59

## 2022-07-07 ENCOUNTER — Other Ambulatory Visit (HOSPITAL_BASED_OUTPATIENT_CLINIC_OR_DEPARTMENT_OTHER): Payer: Self-pay

## 2022-07-09 ENCOUNTER — Inpatient Hospital Stay (HOSPITAL_BASED_OUTPATIENT_CLINIC_OR_DEPARTMENT_OTHER): Admission: RE | Admit: 2022-07-09 | Payer: 59 | Source: Ambulatory Visit

## 2022-07-14 ENCOUNTER — Other Ambulatory Visit (HOSPITAL_BASED_OUTPATIENT_CLINIC_OR_DEPARTMENT_OTHER): Payer: Self-pay

## 2022-07-21 ENCOUNTER — Other Ambulatory Visit (HOSPITAL_BASED_OUTPATIENT_CLINIC_OR_DEPARTMENT_OTHER): Payer: Self-pay

## 2022-07-25 ENCOUNTER — Other Ambulatory Visit (HOSPITAL_BASED_OUTPATIENT_CLINIC_OR_DEPARTMENT_OTHER): Payer: Self-pay

## 2022-07-25 DIAGNOSIS — R0602 Shortness of breath: Secondary | ICD-10-CM | POA: Diagnosis not present

## 2022-07-25 DIAGNOSIS — Z6828 Body mass index (BMI) 28.0-28.9, adult: Secondary | ICD-10-CM | POA: Diagnosis not present

## 2022-07-25 DIAGNOSIS — M1711 Unilateral primary osteoarthritis, right knee: Secondary | ICD-10-CM | POA: Diagnosis not present

## 2022-07-25 DIAGNOSIS — E7800 Pure hypercholesterolemia, unspecified: Secondary | ICD-10-CM | POA: Diagnosis not present

## 2022-07-25 DIAGNOSIS — I10 Essential (primary) hypertension: Secondary | ICD-10-CM | POA: Diagnosis not present

## 2022-07-25 DIAGNOSIS — M5136 Other intervertebral disc degeneration, lumbar region: Secondary | ICD-10-CM | POA: Diagnosis not present

## 2022-07-25 DIAGNOSIS — R700 Elevated erythrocyte sedimentation rate: Secondary | ICD-10-CM | POA: Diagnosis not present

## 2022-07-25 DIAGNOSIS — Z23 Encounter for immunization: Secondary | ICD-10-CM | POA: Diagnosis not present

## 2022-07-25 MED ORDER — WEGOVY 0.25 MG/0.5ML ~~LOC~~ SOAJ
0.2500 mg | SUBCUTANEOUS | 0 refills | Status: DC
  Filled 2022-07-25: qty 2, 28d supply, fill #0

## 2022-07-28 ENCOUNTER — Other Ambulatory Visit (HOSPITAL_BASED_OUTPATIENT_CLINIC_OR_DEPARTMENT_OTHER): Payer: Self-pay

## 2022-07-31 ENCOUNTER — Other Ambulatory Visit (HOSPITAL_BASED_OUTPATIENT_CLINIC_OR_DEPARTMENT_OTHER): Payer: Self-pay

## 2022-08-04 ENCOUNTER — Other Ambulatory Visit (HOSPITAL_BASED_OUTPATIENT_CLINIC_OR_DEPARTMENT_OTHER): Payer: Self-pay

## 2022-08-12 ENCOUNTER — Ambulatory Visit (HOSPITAL_BASED_OUTPATIENT_CLINIC_OR_DEPARTMENT_OTHER)
Admission: RE | Admit: 2022-08-12 | Discharge: 2022-08-12 | Disposition: A | Discharge status: Home / Self Care | Payer: 59 | Source: Ambulatory Visit | Attending: Obstetrics and Gynecology | Admitting: Obstetrics and Gynecology

## 2022-08-12 ENCOUNTER — Encounter (HOSPITAL_BASED_OUTPATIENT_CLINIC_OR_DEPARTMENT_OTHER): Payer: Self-pay

## 2022-08-12 DIAGNOSIS — Z1231 Encounter for screening mammogram for malignant neoplasm of breast: Secondary | ICD-10-CM

## 2022-08-18 ENCOUNTER — Other Ambulatory Visit (HOSPITAL_BASED_OUTPATIENT_CLINIC_OR_DEPARTMENT_OTHER): Payer: Self-pay

## 2022-09-02 ENCOUNTER — Other Ambulatory Visit (HOSPITAL_BASED_OUTPATIENT_CLINIC_OR_DEPARTMENT_OTHER): Payer: Self-pay

## 2022-09-08 ENCOUNTER — Other Ambulatory Visit (HOSPITAL_BASED_OUTPATIENT_CLINIC_OR_DEPARTMENT_OTHER): Payer: Self-pay

## 2022-09-08 DIAGNOSIS — M23362 Other meniscus derangements, other lateral meniscus, left knee: Secondary | ICD-10-CM | POA: Diagnosis not present

## 2022-09-08 DIAGNOSIS — M1711 Unilateral primary osteoarthritis, right knee: Secondary | ICD-10-CM | POA: Diagnosis not present

## 2022-09-08 DIAGNOSIS — M23332 Other meniscus derangements, other medial meniscus, left knee: Secondary | ICD-10-CM | POA: Diagnosis not present

## 2022-09-10 ENCOUNTER — Other Ambulatory Visit (HOSPITAL_BASED_OUTPATIENT_CLINIC_OR_DEPARTMENT_OTHER): Payer: Self-pay

## 2022-09-10 DIAGNOSIS — E785 Hyperlipidemia, unspecified: Secondary | ICD-10-CM | POA: Diagnosis not present

## 2022-09-10 DIAGNOSIS — E663 Overweight: Secondary | ICD-10-CM | POA: Diagnosis not present

## 2022-09-10 DIAGNOSIS — E559 Vitamin D deficiency, unspecified: Secondary | ICD-10-CM | POA: Diagnosis not present

## 2022-09-10 DIAGNOSIS — R5383 Other fatigue: Secondary | ICD-10-CM | POA: Diagnosis not present

## 2022-09-10 DIAGNOSIS — I10 Essential (primary) hypertension: Secondary | ICD-10-CM | POA: Diagnosis not present

## 2022-09-10 DIAGNOSIS — E042 Nontoxic multinodular goiter: Secondary | ICD-10-CM | POA: Diagnosis not present

## 2022-09-10 DIAGNOSIS — Z7689 Persons encountering health services in other specified circumstances: Secondary | ICD-10-CM | POA: Diagnosis not present

## 2022-09-12 ENCOUNTER — Other Ambulatory Visit (HOSPITAL_BASED_OUTPATIENT_CLINIC_OR_DEPARTMENT_OTHER): Payer: Self-pay

## 2022-09-15 ENCOUNTER — Other Ambulatory Visit (HOSPITAL_BASED_OUTPATIENT_CLINIC_OR_DEPARTMENT_OTHER): Payer: Self-pay

## 2022-09-15 ENCOUNTER — Other Ambulatory Visit: Payer: Self-pay | Admitting: Cardiology

## 2022-09-15 MED ORDER — HYDROCHLOROTHIAZIDE 12.5 MG PO CAPS
12.5000 mg | ORAL_CAPSULE | Freq: Every day | ORAL | 0 refills | Status: DC
  Filled 2022-09-15: qty 90, 90d supply, fill #0

## 2022-09-17 ENCOUNTER — Other Ambulatory Visit: Payer: Self-pay | Admitting: Cardiology

## 2022-09-17 ENCOUNTER — Other Ambulatory Visit (HOSPITAL_BASED_OUTPATIENT_CLINIC_OR_DEPARTMENT_OTHER): Payer: Self-pay

## 2022-09-17 MED ORDER — CARVEDILOL 12.5 MG PO TABS
25.0000 mg | ORAL_TABLET | Freq: Every day | ORAL | 0 refills | Status: DC
  Filled 2022-09-17: qty 180, 90d supply, fill #0

## 2022-09-18 ENCOUNTER — Other Ambulatory Visit (HOSPITAL_BASED_OUTPATIENT_CLINIC_OR_DEPARTMENT_OTHER): Payer: Self-pay

## 2022-09-18 MED ORDER — HYDROCORTISONE ACETATE 25 MG RE SUPP
RECTAL | 0 refills | Status: DC
  Filled 2022-09-18: qty 20, 10d supply, fill #0

## 2022-09-19 ENCOUNTER — Other Ambulatory Visit (HOSPITAL_BASED_OUTPATIENT_CLINIC_OR_DEPARTMENT_OTHER): Payer: Self-pay

## 2022-10-02 ENCOUNTER — Other Ambulatory Visit (HOSPITAL_BASED_OUTPATIENT_CLINIC_OR_DEPARTMENT_OTHER): Payer: Self-pay

## 2022-10-13 ENCOUNTER — Other Ambulatory Visit (HOSPITAL_BASED_OUTPATIENT_CLINIC_OR_DEPARTMENT_OTHER): Payer: Self-pay

## 2022-10-14 ENCOUNTER — Other Ambulatory Visit (HOSPITAL_BASED_OUTPATIENT_CLINIC_OR_DEPARTMENT_OTHER): Payer: Self-pay

## 2022-10-16 ENCOUNTER — Other Ambulatory Visit (HOSPITAL_BASED_OUTPATIENT_CLINIC_OR_DEPARTMENT_OTHER): Payer: Self-pay

## 2022-10-16 MED ORDER — NORETHINDRONE ACETATE 5 MG PO TABS
5.0000 mg | ORAL_TABLET | Freq: Every day | ORAL | 0 refills | Status: DC
  Filled 2022-10-16: qty 90, 90d supply, fill #0

## 2022-10-17 ENCOUNTER — Other Ambulatory Visit (HOSPITAL_BASED_OUTPATIENT_CLINIC_OR_DEPARTMENT_OTHER): Payer: Self-pay

## 2022-10-25 ENCOUNTER — Other Ambulatory Visit (HOSPITAL_BASED_OUTPATIENT_CLINIC_OR_DEPARTMENT_OTHER): Payer: Self-pay

## 2022-10-29 ENCOUNTER — Other Ambulatory Visit (HOSPITAL_BASED_OUTPATIENT_CLINIC_OR_DEPARTMENT_OTHER): Payer: Self-pay

## 2022-11-01 DIAGNOSIS — J069 Acute upper respiratory infection, unspecified: Secondary | ICD-10-CM | POA: Diagnosis not present

## 2022-11-03 ENCOUNTER — Other Ambulatory Visit (HOSPITAL_BASED_OUTPATIENT_CLINIC_OR_DEPARTMENT_OTHER): Payer: Self-pay

## 2022-11-03 ENCOUNTER — Other Ambulatory Visit: Payer: Self-pay | Admitting: Cardiology

## 2022-11-03 ENCOUNTER — Other Ambulatory Visit: Payer: Self-pay

## 2022-11-03 MED ORDER — TELMISARTAN 40 MG PO TABS
40.0000 mg | ORAL_TABLET | Freq: Every day | ORAL | 0 refills | Status: DC
  Filled 2022-11-03: qty 90, 90d supply, fill #0

## 2022-11-04 ENCOUNTER — Other Ambulatory Visit (HOSPITAL_BASED_OUTPATIENT_CLINIC_OR_DEPARTMENT_OTHER): Payer: Self-pay

## 2022-11-04 MED ORDER — HYDROCORTISONE ACETATE 25 MG RE SUPP
25.0000 mg | Freq: Two times a day (BID) | RECTAL | 0 refills | Status: AC
  Filled 2022-11-04 – 2022-12-22 (×2): qty 20, 10d supply, fill #0

## 2022-11-04 MED ORDER — HYDROCORTISONE ACETATE 25 MG RE SUPP
RECTAL | 0 refills | Status: DC
  Filled 2022-11-04: qty 20, 10d supply, fill #0

## 2022-11-07 ENCOUNTER — Other Ambulatory Visit (HOSPITAL_BASED_OUTPATIENT_CLINIC_OR_DEPARTMENT_OTHER): Payer: Self-pay

## 2022-11-07 ENCOUNTER — Telehealth: Payer: Commercial Managed Care - PPO | Admitting: Physician Assistant

## 2022-11-07 DIAGNOSIS — J4521 Mild intermittent asthma with (acute) exacerbation: Secondary | ICD-10-CM | POA: Diagnosis not present

## 2022-11-07 MED ORDER — AZITHROMYCIN 250 MG PO TABS
ORAL_TABLET | ORAL | 0 refills | Status: AC
  Filled 2022-11-07: qty 6, 5d supply, fill #0

## 2022-11-07 MED ORDER — PREDNISONE 5 MG PO TABS
ORAL_TABLET | ORAL | 0 refills | Status: AC
  Filled 2022-11-07: qty 21, 6d supply, fill #0

## 2022-11-07 MED ORDER — PREDNISONE 5 MG (21) PO TBPK: ORAL_TABLET | ORAL | 0 refills | Status: DC

## 2022-11-07 NOTE — Patient Instructions (Signed)
Amea Redford, thank you for joining [place], PA-C for today's virtual visit.  While this provider is not your primary care provider (PCP), if your PCP is located in our provider database this encounter information will be shared with them immediately following your visit.   [facility] account gives you access to today's visit and all your visits, tests, and labs performed at [facility] " click here if you don't have a [facility] account or go to [email or web address]  Consent: (Patient) Lindsay Burke provided verbal consent for this virtual visit at the beginning of the encounter.  Current Medications:  Current Outpatient Medications:    predniSONE (STERAPRED UNI-PAK 21 TAB) 5 MG (21) TBPK tablet, Take as directed, Disp: 1 each, Rfl: 0   acetaminophen (TYLENOL) 500 MG tablet, Take 1 tablet by mouth every 4 (four) hours as needed for mild pain or fever., Disp: , Rfl:    albuterol (VENTOLIN HFA) 108 (90 Base) MCG/ACT inhaler, Inhale 2 puffs into the lungs every 4 (four) hours as needed for wheezing or shortness of breath., Disp: , Rfl:    azithromycin (ZITHROMAX Z-PAK) 250 MG tablet, Take 2 tablets (500 mg total) by mouth daily for 1 day, THEN 1 tablet (250 mg total) daily for 4 days., Disp: 6 tablet, Rfl: 0   carvedilol (COREG) 12.5 MG tablet, Take 2 tablets (25 mg total) by mouth at bedtime., Disp: 180 tablet, Rfl: 0   cetirizine (ZYRTEC) 10 MG tablet, Take 10 mg by mouth daily. 0.5 TABLET AT BEDTIME, Disp: , Rfl:    Cholecalciferol (VITAMIN D3) 125 MCG (5000 UT) TABS, Take 1 tablet by mouth daily. , Disp: , Rfl:    Cyanocobalamin (B-12) 500 MCG SUBL, Place 1 tablet under the tongue daily at 12 noon., Disp: , Rfl:    diclofenac Sodium (VOLTAREN) 1 % GEL, Apply 4 g topically 4 times daily for 30 days., Disp: 500 g, Rfl: 0   fluticasone (FLONASE) 50 MCG/ACT nasal spray, SPRAY 1 SPRAY INTO EACH NOSTRIL EVERY DAY, Disp: , Rfl:    fluticasone (FLOVENT HFA)  110 MCG/ACT inhaler, Inhale 2 puffs into the lungs 2 (two) times daily., Disp: 1 Inhaler, Rfl: 0   hydrochlorothiazide (MICROZIDE) 12.5 MG capsule, Take 1 capsule (12.5 mg total) by mouth daily., Disp: 90 capsule, Rfl: 0   hydrocortisone (ANUSOL-HC) 25 MG suppository, Place 1 suppository (25 mg total) rectally 2 times daily for 10 days., Disp: 20 suppository, Rfl: 0   hydrocortisone (ANUSOL-HC) 25 MG suppository, Place 1 suppository (25 mg total) rectally 2 (two) times daily for 10 days., Disp: 20 suppository, Rfl: 0   MAGNESIUM OXIDE 400 PO, Take 1 tablet by mouth at bedtime., Disp: , Rfl:    metroNIDAZOLE (METROCREAM) 0.75 % cream, Apply to the face on to the skin 2 times daily, Disp: 45 g, Rfl: 2   norethindrone (AYGESTIN) 5 MG tablet, Take 1 tablet (5 mg total) by mouth daily., Disp: 90 tablet, Rfl: 0   predniSONE (DELTASONE) 5 MG tablet, Take 6 tablets (30 mg total) by mouth daily for 1 day, THEN 5 tablets (25 mg total) daily for 1 day, THEN 4 tablets (20 mg total) daily for 1 day, THEN 3 tablets (15 mg total) daily for 1 day, THEN 2 tablets (10 mg total) daily for 1 day, THEN 1 tablet (5 mg total) daily for 1 day., Disp: 21 tablet, Rfl: 0   Semaglutide-Weight Management (WEGOVY) 0.25 MG/0.5ML SOAJ, Inject 0.5 mLs (0.25 mg dose) into  the skin every 7 (seven) days for 30 days. (Patient not taking: Reported on 02/10/2022), Disp: 2 mL, Rfl: 0   Semaglutide-Weight Management (WEGOVY) 0.25 MG/0.5ML SOAJ, Inject 0.25 mg into the skin once a week., Disp: 2 mL, Rfl: 0   Semaglutide-Weight Management (WEGOVY) 0.5 MG/0.5ML SOAJ, Inject 0.5 mg into the skin once a week., Disp: 2 mL, Rfl: 0   Semaglutide-Weight Management (WEGOVY) 1 MG/0.5ML SOAJ, Inject 1 mg into the skin every 7 (seven) days for 30 days. (Patient not taking: Reported on 02/10/2022), Disp: 2 mL, Rfl: 0   Semaglutide-Weight Management (WEGOVY) 1.7 MG/0.75ML SOAJ, Inject 1.7 mg into the skin every 7 (seven) days., Disp: 3 mL, Rfl: 0    Semaglutide-Weight Management (WEGOVY) 2.4 MG/0.75ML SOAJ, Inject 1 dose into the skin every 7 days (Patient not taking: Reported on 02/10/2022), Disp: 3 mL, Rfl: 0   Semaglutide-Weight Management (WEGOVY) 2.4 MG/0.75ML SOAJ, Inject 2.4 mg into the skin every 7 (seven) days., Disp: 3 mL, Rfl: 1   telmisartan (MICARDIS) 40 MG tablet, Take 1 tablet (40 mg total) by mouth daily., Disp: 90 tablet, Rfl: 0   tretinoin (RETIN-A) 0.025 % cream, Apply a pea sized amount to the face at bedtime, Disp: 20 g, Rfl: 3   Medications ordered in this encounter:  Meds ordered this encounter  Medications   predniSONE (STERAPRED UNI-PAK 21 TAB) 5 MG (21) TBPK tablet    Sig: Take as directed    Dispense:  1 each    Refill:  0    Order Specific Question:   Supervising Provider    Answer:   Chase Picket [id number]     *If you need refills on other medications prior to your next appointment, please contact your pharmacy*  Follow-Up: Call back or seek an in-person evaluation if the symptoms worsen or if the condition fails to improve as anticipated.  [place] [phone]  Other Instructions Take prednisone as prescribed.  Continue with supportive care.  Follow up with Primary Care Physician if symptoms persist.    If you have been instructed to have an in-person evaluation today at a local Urgent Care facility, please use the link below. It will take you to a list of all of our available [facility], including address, phone number and hours of operation. Please do not delay care.  [facility]  If you or a family member do not have a primary care provider, use the link below to schedule a visit and establish care. When you choose a [facility] primary care physician or advanced practice provider, you gain a long-term partner in health. Find a Primary Care Provider  Learn more about [facility]'s in-office and virtual care options: [facility]

## 2022-11-07 NOTE — Progress Notes (Signed)
Virtual Visit Consent   Lindsay Burke, you are scheduled for a virtual visit with a [facility] provider today. Just as with appointments in the office, your consent must be obtained to participate. Your consent will be active for this visit and any virtual visit you may have with one of our providers in the next 365 days. If you have a MyChart account, a copy of this consent can be sent to you electronically.  As this is a virtual visit, video technology does not allow for your provider to perform a traditional examination. This may limit your provider's ability to fully assess your condition. If your provider identifies any concerns that need to be evaluated in person or the need to arrange testing (such as labs, EKG, etc.), we will make arrangements to do so. Although advances in technology are sophisticated, we cannot ensure that it will always work on either your end or our end. If the connection with a video visit is poor, the visit may have to be switched to a telephone visit. With either a video or telephone visit, we are not always able to ensure that we have a secure connection.  By engaging in this virtual visit, you consent to the provision of healthcare and authorize for your insurance to be billed (if applicable) for the services provided during this visit. Depending on your insurance coverage, you may receive a charge related to this service.  I need to obtain your verbal consent now. Are you willing to proceed with your visit today? Lindsay Burke has provided verbal consent on 11/07/2022 for a virtual visit (video or telephone). Lenise Arena Ward, PA-C  Date: 11/07/2022 5:53 PM  Virtual Visit via Video Note   I, Lenise Arena Ward, connected with  Lindsay Burke  ([id number], 25-Oct-1970) on 11/07/22 at  5:45 PM EST by a video-enabled telemedicine application and verified that I am speaking with the correct person using two identifiers.  Location: Patient: Virtual Visit Location Patient:  Home Provider: Virtual Visit Location Provider: Home Office   I discussed the limitations of evaluation and management by telemedicine and the availability of in person appointments. The patient expressed understanding and agreed to proceed.    History of Present Illness: Lindsay Burke is a 52 y.o. who identifies as a female who was assigned female at birth, and is being seen today for asthma flare up.  She reports she had COVID and strep, diagnosed two weeks ago.  Seen by PCP last Saturday, given a sample of treligy and a zpack, prednisone dosepak which she finished yesterday.  She is using her albuterol inhaler, has reduced number of times per day.  She denies wheezing, but reports shortness of breath is helping.   HPI: HPI  Problems:  Patient Active Problem List   Diagnosis Date Noted   Asthma 08/22/2021   Palpitations 08/28/2020   Dyspnea due to COVID-19 12/21/2019   COVID-19 10/25/2019   Essential hypertension 09/05/2019   Malaise and fatigue 09/05/2019   Cardiac risk counseling 09/05/2019   Snores 09/05/2019   Mild intermittent asthma with acute exacerbation 07/09/2018   Vitamin D deficiency 07/09/2018   Fibrocystic breast changes, left 11/09/2017   Hashimoto's disease 06/19/2017   Abnormal uterine bleeding 03/12/2016   History of endometrial ablation 03/12/2016   Menorrhagia with irregular cycle 03/12/2016   Thyroid disorder 03/12/2016   Postcoital bleeding 03/12/2016   Abnormality of left breast on screening mammogram 12/03/2015   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   Protrusion of intervertebral  disc of lumbosacral region 02/21/2015   Right-sided low back pain without sciatica 01/30/2015   SI (sacroiliac) joint dysfunction 01/30/2015   History of renal stone 03/08/2014   H/O knee surgery 03/08/2014   Hx of cholecystectomy 03/08/2014   Fatigue 01/18/2014   Multinodular goiter 01/18/2014    Allergies:  Allergies  Allergen Reactions   Acetaminophen  Hypertension, Other (See Comments) and Rash    Tylenol arthritis puts into CHF    Clindamycin/Lincomycin Diarrhea   Hydralazine Shortness Of Breath and Swelling   Phentermine Palpitations and Hypertension   Chlorthalidone Hypertension        Nifedipine Rash and Hypertension   Clonidine Other (See Comments)    NIGHTMARES, SLEEPY   Spironolactone Hypertension   Zetia [Ezetimibe] Other (See Comments)    Muscle pain   Aleve [Naproxen] Palpitations    HR 120   Cephalexin Rash   Medications:  Current Outpatient Medications:    predniSONE (STERAPRED UNI-PAK 21 TAB) 5 MG (21) TBPK tablet, Take as directed, Disp: 1 each, Rfl: 0   acetaminophen (TYLENOL) 500 MG tablet, Take 1 tablet by mouth every 4 (four) hours as needed for mild pain or fever., Disp: , Rfl:    albuterol (VENTOLIN HFA) 108 (90 Base) MCG/ACT inhaler, Inhale 2 puffs into the lungs every 4 (four) hours as needed for wheezing or shortness of breath., Disp: , Rfl:    azithromycin (ZITHROMAX Z-PAK) 250 MG tablet, Take 2 tablets (500 mg total) by mouth daily for 1 day, THEN 1 tablet (250 mg total) daily for 4 days., Disp: 6 tablet, Rfl: 0   carvedilol (COREG) 12.5 MG tablet, Take 2 tablets (25 mg total) by mouth at bedtime., Disp: 180 tablet, Rfl: 0   cetirizine (ZYRTEC) 10 MG tablet, Take 10 mg by mouth daily. 0.5 TABLET AT BEDTIME, Disp: , Rfl:    Cholecalciferol (VITAMIN D3) 125 MCG (5000 UT) TABS, Take 1 tablet by mouth daily. , Disp: , Rfl:    Cyanocobalamin (B-12) 500 MCG SUBL, Place 1 tablet under the tongue daily at 12 noon., Disp: , Rfl:    diclofenac Sodium (VOLTAREN) 1 % GEL, Apply 4 g topically 4 times daily for 30 days., Disp: 500 g, Rfl: 0   fluticasone (FLONASE) 50 MCG/ACT nasal spray, SPRAY 1 SPRAY INTO EACH NOSTRIL EVERY DAY, Disp: , Rfl:    fluticasone (FLOVENT HFA) 110 MCG/ACT inhaler, Inhale 2 puffs into the lungs 2 (two) times daily., Disp: 1 Inhaler, Rfl: 0   hydrochlorothiazide (MICROZIDE) 12.5 MG capsule, Take  1 capsule (12.5 mg total) by mouth daily., Disp: 90 capsule, Rfl: 0   hydrocortisone (ANUSOL-HC) 25 MG suppository, Place 1 suppository (25 mg total) rectally 2 times daily for 10 days., Disp: 20 suppository, Rfl: 0   hydrocortisone (ANUSOL-HC) 25 MG suppository, Place 1 suppository (25 mg total) rectally 2 (two) times daily for 10 days., Disp: 20 suppository, Rfl: 0   MAGNESIUM OXIDE 400 PO, Take 1 tablet by mouth at bedtime., Disp: , Rfl:    metroNIDAZOLE (METROCREAM) 0.75 % cream, Apply to the face on to the skin 2 times daily, Disp: 45 g, Rfl: 2   norethindrone (AYGESTIN) 5 MG tablet, Take 1 tablet (5 mg total) by mouth daily., Disp: 90 tablet, Rfl: 0   predniSONE (DELTASONE) 5 MG tablet, Take 6 tablets (30 mg total) by mouth daily for 1 day, THEN 5 tablets (25 mg total) daily for 1 day, THEN 4 tablets (20 mg total) daily for 1 day, THEN 3 tablets (  15 mg total) daily for 1 day, THEN 2 tablets (10 mg total) daily for 1 day, THEN 1 tablet (5 mg total) daily for 1 day., Disp: 21 tablet, Rfl: 0   Semaglutide-Weight Management (WEGOVY) 0.25 MG/0.5ML SOAJ, Inject 0.5 mLs (0.25 mg dose) into the skin every 7 (seven) days for 30 days. (Patient not taking: Reported on 02/10/2022), Disp: 2 mL, Rfl: 0   Semaglutide-Weight Management (WEGOVY) 0.25 MG/0.5ML SOAJ, Inject 0.25 mg into the skin once a week., Disp: 2 mL, Rfl: 0   Semaglutide-Weight Management (WEGOVY) 0.5 MG/0.5ML SOAJ, Inject 0.5 mg into the skin once a week., Disp: 2 mL, Rfl: 0   Semaglutide-Weight Management (WEGOVY) 1 MG/0.5ML SOAJ, Inject 1 mg into the skin every 7 (seven) days for 30 days. (Patient not taking: Reported on 02/10/2022), Disp: 2 mL, Rfl: 0   Semaglutide-Weight Management (WEGOVY) 1.7 MG/0.75ML SOAJ, Inject 1.7 mg into the skin every 7 (seven) days., Disp: 3 mL, Rfl: 0   Semaglutide-Weight Management (WEGOVY) 2.4 MG/0.75ML SOAJ, Inject 1 dose into the skin every 7 days (Patient not taking: Reported on 02/10/2022), Disp: 3 mL, Rfl: 0    Semaglutide-Weight Management (WEGOVY) 2.4 MG/0.75ML SOAJ, Inject 2.4 mg into the skin every 7 (seven) days., Disp: 3 mL, Rfl: 1   telmisartan (MICARDIS) 40 MG tablet, Take 1 tablet (40 mg total) by mouth daily., Disp: 90 tablet, Rfl: 0   tretinoin (RETIN-A) 0.025 % cream, Apply a pea sized amount to the face at bedtime, Disp: 20 g, Rfl: 3  Observations/Objective: Patient is well-developed, well-nourished in no acute distress.  Resting comfortably at home.  Head is normocephalic, atraumatic.  No labored breathing.  Speech is clear and coherent with logical content.  Patient is alert and oriented at baseline.    Assessment and Plan: 1. Mild intermittent asthma with exacerbation - predniSONE (STERAPRED UNI-PAK 21 TAB) 5 MG (21) TBPK tablet; Take as directed  Dispense: 1 each; Refill: 0  Advised continue supportive care, ED precautions, and follow up with PCP   Follow Up Instructions: I discussed the assessment and treatment plan with the patient. The patient was provided an opportunity to ask questions and all were answered. The patient agreed with the plan and demonstrated an understanding of the instructions.  A copy of instructions were sent to the patient via MyChart unless otherwise noted below.    The patient was advised to call back or seek an in-person evaluation if the symptoms worsen or if the condition fails to improve as anticipated.  Time:  I spent 17 minutes with the patient via telehealth technology discussing the above problems/concerns.    Lenise Arena Ward, PA-C

## 2022-11-10 ENCOUNTER — Other Ambulatory Visit (HOSPITAL_BASED_OUTPATIENT_CLINIC_OR_DEPARTMENT_OTHER): Payer: Self-pay

## 2022-11-11 ENCOUNTER — Other Ambulatory Visit (HOSPITAL_BASED_OUTPATIENT_CLINIC_OR_DEPARTMENT_OTHER): Payer: Self-pay

## 2022-11-25 ENCOUNTER — Other Ambulatory Visit (HOSPITAL_BASED_OUTPATIENT_CLINIC_OR_DEPARTMENT_OTHER): Payer: Self-pay

## 2022-11-26 ENCOUNTER — Other Ambulatory Visit (HOSPITAL_BASED_OUTPATIENT_CLINIC_OR_DEPARTMENT_OTHER): Payer: Self-pay

## 2022-11-26 DIAGNOSIS — R252 Cramp and spasm: Secondary | ICD-10-CM | POA: Diagnosis not present

## 2022-11-26 DIAGNOSIS — Z01419 Encounter for gynecological examination (general) (routine) without abnormal findings: Secondary | ICD-10-CM | POA: Diagnosis not present

## 2022-11-26 DIAGNOSIS — N921 Excessive and frequent menstruation with irregular cycle: Secondary | ICD-10-CM | POA: Diagnosis not present

## 2022-11-26 MED ORDER — NORETHINDRONE ACETATE 5 MG PO TABS
5.0000 mg | ORAL_TABLET | Freq: Every day | ORAL | 3 refills | Status: DC
  Filled 2022-11-26 – 2023-01-08 (×2): qty 90, 90d supply, fill #0
  Filled 2023-01-14 – 2023-04-10 (×2): qty 90, 90d supply, fill #1
  Filled 2023-07-09: qty 90, 90d supply, fill #2
  Filled 2023-10-05: qty 90, 90d supply, fill #3

## 2022-11-27 ENCOUNTER — Other Ambulatory Visit (HOSPITAL_COMMUNITY): Payer: Self-pay

## 2022-12-10 ENCOUNTER — Other Ambulatory Visit (HOSPITAL_BASED_OUTPATIENT_CLINIC_OR_DEPARTMENT_OTHER): Payer: Self-pay

## 2022-12-22 ENCOUNTER — Other Ambulatory Visit (HOSPITAL_BASED_OUTPATIENT_CLINIC_OR_DEPARTMENT_OTHER): Payer: Self-pay

## 2022-12-22 ENCOUNTER — Other Ambulatory Visit: Payer: Self-pay | Admitting: Cardiology

## 2022-12-22 MED ORDER — CARVEDILOL 12.5 MG PO TABS
25.0000 mg | ORAL_TABLET | Freq: Every day | ORAL | 1 refills | Status: DC
  Filled 2022-12-22: qty 60, 30d supply, fill #0
  Filled 2023-01-21: qty 60, 30d supply, fill #1

## 2022-12-22 MED ORDER — HYDROCHLOROTHIAZIDE 12.5 MG PO CAPS
12.5000 mg | ORAL_CAPSULE | Freq: Every day | ORAL | 1 refills | Status: DC
  Filled 2022-12-22: qty 30, 30d supply, fill #0
  Filled 2023-01-30: qty 30, 30d supply, fill #1

## 2023-01-08 ENCOUNTER — Other Ambulatory Visit (HOSPITAL_BASED_OUTPATIENT_CLINIC_OR_DEPARTMENT_OTHER): Payer: Self-pay

## 2023-01-09 ENCOUNTER — Other Ambulatory Visit (HOSPITAL_BASED_OUTPATIENT_CLINIC_OR_DEPARTMENT_OTHER): Payer: Self-pay

## 2023-01-14 ENCOUNTER — Other Ambulatory Visit (HOSPITAL_BASED_OUTPATIENT_CLINIC_OR_DEPARTMENT_OTHER): Payer: Self-pay

## 2023-01-29 ENCOUNTER — Other Ambulatory Visit (HOSPITAL_BASED_OUTPATIENT_CLINIC_OR_DEPARTMENT_OTHER): Payer: Self-pay

## 2023-01-30 ENCOUNTER — Other Ambulatory Visit: Payer: Self-pay | Admitting: Cardiology

## 2023-01-30 ENCOUNTER — Other Ambulatory Visit (HOSPITAL_BASED_OUTPATIENT_CLINIC_OR_DEPARTMENT_OTHER): Payer: Self-pay

## 2023-01-30 MED ORDER — TELMISARTAN 40 MG PO TABS
40.0000 mg | ORAL_TABLET | Freq: Every day | ORAL | 0 refills | Status: DC
  Filled 2023-01-30: qty 90, 90d supply, fill #0

## 2023-02-02 ENCOUNTER — Other Ambulatory Visit (HOSPITAL_BASED_OUTPATIENT_CLINIC_OR_DEPARTMENT_OTHER): Payer: Self-pay

## 2023-02-02 MED ORDER — TRELEGY ELLIPTA 100-62.5-25 MCG/ACT IN AEPB
1.0000 | INHALATION_SPRAY | Freq: Every day | RESPIRATORY_TRACT | 1 refills | Status: DC
  Filled 2023-02-02: qty 60, 60d supply, fill #0
  Filled 2023-10-08: qty 60, 30d supply, fill #0

## 2023-02-05 ENCOUNTER — Other Ambulatory Visit (HOSPITAL_BASED_OUTPATIENT_CLINIC_OR_DEPARTMENT_OTHER): Payer: Self-pay

## 2023-02-12 ENCOUNTER — Other Ambulatory Visit (HOSPITAL_BASED_OUTPATIENT_CLINIC_OR_DEPARTMENT_OTHER): Payer: Self-pay

## 2023-02-16 ENCOUNTER — Other Ambulatory Visit: Payer: Self-pay | Admitting: Cardiology

## 2023-02-16 ENCOUNTER — Other Ambulatory Visit (HOSPITAL_BASED_OUTPATIENT_CLINIC_OR_DEPARTMENT_OTHER): Payer: Self-pay

## 2023-02-16 MED ORDER — CARVEDILOL 12.5 MG PO TABS
25.0000 mg | ORAL_TABLET | Freq: Every day | ORAL | 0 refills | Status: DC
  Filled 2023-02-16: qty 60, 30d supply, fill #0

## 2023-02-26 ENCOUNTER — Other Ambulatory Visit (HOSPITAL_BASED_OUTPATIENT_CLINIC_OR_DEPARTMENT_OTHER): Payer: Self-pay

## 2023-02-26 ENCOUNTER — Other Ambulatory Visit: Payer: Self-pay | Admitting: Cardiology

## 2023-02-26 MED ORDER — HYDROCHLOROTHIAZIDE 12.5 MG PO CAPS
12.5000 mg | ORAL_CAPSULE | Freq: Every day | ORAL | 1 refills | Status: DC
  Filled 2023-02-26: qty 30, 30d supply, fill #0
  Filled 2023-04-01: qty 30, 30d supply, fill #1

## 2023-02-26 NOTE — Telephone Encounter (Signed)
Refill to pharmacy, patient needs appointment for future refills 

## 2023-03-16 ENCOUNTER — Other Ambulatory Visit (HOSPITAL_BASED_OUTPATIENT_CLINIC_OR_DEPARTMENT_OTHER): Payer: Self-pay

## 2023-03-16 ENCOUNTER — Other Ambulatory Visit: Payer: Self-pay | Admitting: Cardiology

## 2023-03-16 MED ORDER — CARVEDILOL 12.5 MG PO TABS
25.0000 mg | ORAL_TABLET | Freq: Every day | ORAL | 0 refills | Status: DC
  Filled 2023-03-16: qty 30, 15d supply, fill #0

## 2023-04-01 ENCOUNTER — Other Ambulatory Visit: Payer: Self-pay | Admitting: Cardiology

## 2023-04-01 ENCOUNTER — Other Ambulatory Visit (HOSPITAL_BASED_OUTPATIENT_CLINIC_OR_DEPARTMENT_OTHER): Payer: Self-pay

## 2023-04-01 MED ORDER — CARVEDILOL 12.5 MG PO TABS
25.0000 mg | ORAL_TABLET | Freq: Every day | ORAL | 0 refills | Status: DC
  Filled 2023-04-01: qty 30, 15d supply, fill #0

## 2023-04-02 ENCOUNTER — Other Ambulatory Visit: Payer: Self-pay

## 2023-04-02 DIAGNOSIS — I10 Essential (primary) hypertension: Secondary | ICD-10-CM | POA: Insufficient documentation

## 2023-04-03 ENCOUNTER — Other Ambulatory Visit (HOSPITAL_BASED_OUTPATIENT_CLINIC_OR_DEPARTMENT_OTHER): Payer: Self-pay

## 2023-04-03 ENCOUNTER — Encounter: Payer: Self-pay | Admitting: Cardiology

## 2023-04-03 ENCOUNTER — Ambulatory Visit: Payer: Commercial Managed Care - PPO | Attending: Cardiology | Admitting: Cardiology

## 2023-04-03 VITALS — BP 106/68 | HR 69 | Ht 63.0 in | Wt 156.0 lb

## 2023-04-03 DIAGNOSIS — E7800 Pure hypercholesterolemia, unspecified: Secondary | ICD-10-CM

## 2023-04-03 DIAGNOSIS — I340 Nonrheumatic mitral (valve) insufficiency: Secondary | ICD-10-CM

## 2023-04-03 DIAGNOSIS — E782 Mixed hyperlipidemia: Secondary | ICD-10-CM

## 2023-04-03 DIAGNOSIS — Z8249 Family history of ischemic heart disease and other diseases of the circulatory system: Secondary | ICD-10-CM

## 2023-04-03 DIAGNOSIS — I10 Essential (primary) hypertension: Secondary | ICD-10-CM

## 2023-04-03 DIAGNOSIS — E663 Overweight: Secondary | ICD-10-CM

## 2023-04-03 DIAGNOSIS — E785 Hyperlipidemia, unspecified: Secondary | ICD-10-CM

## 2023-04-03 HISTORY — DX: Family history of ischemic heart disease and other diseases of the circulatory system: Z82.49

## 2023-04-03 HISTORY — DX: Overweight: E66.3

## 2023-04-03 HISTORY — DX: Hyperlipidemia, unspecified: E78.5

## 2023-04-03 HISTORY — DX: Mixed hyperlipidemia: E78.2

## 2023-04-03 HISTORY — DX: Nonrheumatic mitral (valve) insufficiency: I34.0

## 2023-04-03 NOTE — Patient Instructions (Addendum)
Medication Instructions:  Your physician recommends that you continue on your current medications as directed. Please refer to the Current Medication list given to you today.  *If you need a refill on your cardiac medications before your next appointment, please call your pharmacy*   Lab Work: Your physician recommends that you have labs done in the office today. We are checking a CMP, TSH and lipids.  If you have labs (blood work) drawn today and your tests are completely normal, you will receive your results only by: MyChart Message (if you have MyChart) OR A paper copy in the mail If you have any lab test that is abnormal or we need to change your treatment, we will call you to review the results.   Testing/Procedures: None ordered   Follow-Up: At [facility], you and your health needs are our priority.  As part of our continuing mission to provide you with exceptional heart care, we have created designated Provider Care Teams.  These Care Teams include your primary Cardiologist (physician) and Advanced Practice Providers (APPs -  Physician Assistants and Nurse Practitioners) who all work together to provide you with the care you need, when you need it.  We recommend signing up for the patient portal called "MyChart".  Sign up information is provided on this After Visit Summary.  MyChart is used to connect with patients for Virtual Visits (Telemedicine).  Patients are able to view lab/test results, encounter notes, upcoming appointments, etc.  Non-urgent messages can be sent to your provider as well.   To learn more about what you can do with MyChart, go to [email or web address].    Your next appointment:   12 month(s)  The format for your next appointment:   In Person  Provider:   Belva Crome, MD    Other Instructions none  Important Information About Sugar

## 2023-04-03 NOTE — Progress Notes (Signed)
Cardiology Office Note:    Date:  04/03/2023   ID:  Lindsay Burke, DOB 11-10-1970, MRN [id number]  PCP:  Kirt Boys, PA-C  Cardiologist:  Garwin Brothers, MD   Referring MD: Kirt Boys,*    ASSESSMENT:    1. Pure hypercholesterolemia   2. Essential hypertension   3. Moderate mitral regurgitation   4. Family history of coronary artery disease   5. Mixed dyslipidemia   6. Overweight (BMI 25.0-29.9)    PLAN:    In order of problems listed above:  Primary prevention stressed with the patient.  Importance of compliance with diet medication stressed and patient verbalized standing.  Patient was advised to walk at least half an hour a day 5 days a week and she promises to do so.  She is overweight and weight reduction was stressed. Mild to moderate mitral regurgitation: Stable at this time and we will continue to monitor.  Echocardiogram follow-up in 1 years follow-up visit. Mixed dyslipidemia: Will check lipids.  Diet and exercise stressed.  Her calcium score is 0.  Aggressive measures with diet and exercise discussed and questions were answered to her satisfaction. Patient will be seen in follow-up appointment in 12 months or earlier if the patient has any concerns.   Medication Adjustments/Labs and Tests Ordered: Current medicines are reviewed at length with the patient today.  Concerns regarding medicines are outlined above.  Orders Placed This Encounter  Procedures   Comprehensive metabolic panel   Lipid panel   TSH   EKG 12-Lead   No orders of the defined types were placed in this encounter.    No chief complaint on file.    History of Present Illness:    Lindsay Burke is a 52 y.o. female.  Patient has past medical history of mild to moderate mitral regurgitation, mixed dyslipidemia and family history of coronary artery disease.  She has seen Dr. Dulce Sellar in the past.  She denies any chest pain orthopnea or PND.  She leads a sedentary lifestyle but  plans to exercise on a regular basis.  At the time of my evaluation, the patient is alert awake oriented and in no distress.  Past Medical History:  Diagnosis Date   Abnormal uterine bleeding 03/12/2016   Abnormality of left breast on screening mammogram 12/03/2015   Asthma    Cardiac risk counseling 09/05/2019   COVID-19 10/25/2019   Dyspnea due to COVID-19 12/21/2019   Essential hypertension 09/05/2019   Fatigue 01/18/2014   Fibrocystic breast changes, left 11/09/2017   H/O knee surgery 03/08/2014   1993   Hashimoto's disease 06/19/2017   History of endometrial ablation 03/12/2016   History of renal stone 03/08/2014   S/p cystoscopy R ureter  S/p cystoscopy R ureter  S/p cystoscopy R ureter   Hx of cholecystectomy 03/08/2014   2008  2008   Hypertension    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   Malaise and fatigue 09/05/2019   Menorrhagia with irregular cycle 03/12/2016   Mild intermittent asthma with acute exacerbation 07/09/2018   Multinodular goiter 01/18/2014   Palpitations 08/28/2020   Postcoital bleeding 03/12/2016   Protrusion of intervertebral disc of lumbosacral region 02/21/2015   Right-sided low back pain without sciatica 01/30/2015   SI (sacroiliac) joint dysfunction 01/30/2015   Snores 09/05/2019   Thyroid disorder 03/12/2016   Vitamin D deficiency 07/09/2018    Past Surgical History:  Procedure Laterality Date   CHOLECYSTECTOMY      Current Medications: Current Meds  Medication Sig   acetaminophen (TYLENOL) 500 MG tablet Take 1 tablet by mouth every 4 (four) hours as needed for mild pain or fever.   albuterol (VENTOLIN HFA) 108 (90 Base) MCG/ACT inhaler Inhale 2 puffs into the lungs every 4 (four) hours as needed for wheezing or shortness of breath.   carvedilol (COREG) 12.5 MG tablet Take 12.5 mg by mouth 2 (two) times daily with a meal.   cetirizine (ZYRTEC) 10 MG tablet Take 10 mg by mouth daily. 0.5 TABLET AT BEDTIME   diclofenac  Sodium (VOLTAREN) 1 % GEL Apply 4 g topically 4 times daily for 30 days.   fluticasone (FLONASE) 50 MCG/ACT nasal spray SPRAY 1 SPRAY INTO EACH NOSTRIL EVERY DAY   Fluticasone-Umeclidin-Vilant (TRELEGY ELLIPTA) 100-62.5-25 MCG/ACT AEPB Inhale 1 puff into the lungs daily. (Patient taking differently: Inhale 1 puff into the lungs as needed (asthma flare up).)   hydrochlorothiazide (MICROZIDE) 12.5 MG capsule Take 1 capsule (12.5 mg total) by mouth daily. Please call office for appointment for future refills   hydrocortisone (ANUSOL-HC) 25 MG suppository Place 1 suppository (25 mg total) rectally 2 (two) times daily for 10 days.   metroNIDAZOLE (METROCREAM) 0.75 % cream Apply to the face on to the skin 2 times daily   norethindrone (AYGESTIN) 5 MG tablet Take 1 tablet (5 mg total) by mouth daily.   Semaglutide-Weight Management (WEGOVY) 1.7 MG/0.75ML SOAJ Inject 1.7 mg into the skin every 7 (seven) days.   Semaglutide-Weight Management (WEGOVY) 2.4 MG/0.75ML SOAJ Inject 2.4 mg into the skin every 7 (seven) days.   telmisartan (MICARDIS) 40 MG tablet Take 1 tablet (40 mg total) by mouth daily.   tretinoin (RETIN-A) 0.025 % cream Apply a pea sized amount to the face at bedtime     Allergies:   Acetaminophen, Clindamycin/lincomycin, Hydralazine, Phentermine, Chlorthalidone, Nifedipine, Clonidine, Spironolactone, Zetia [ezetimibe], Aleve [naproxen], and Cephalexin   Social History   Socioeconomic History   Marital status: Married    Spouse name: Not on file   Number of children: Not on file   Years of education: Not on file   Highest education level: Not on file  Occupational History   Not on file  Tobacco Use   Smoking status: Never    Passive exposure: Never   Smokeless tobacco: Never  Vaping Use   Vaping Use: Never used  Substance and Sexual Activity   Alcohol use: Never   Drug use: Never   Sexual activity: Not on file    Comment: progestrone  Other Topics Concern   Not on file   Social History Narrative   Not on file   Social Determinants of Health   Financial Resource Strain: Not on file  Food Insecurity: Not on file  Transportation Needs: Not on file  Physical Activity: Not on file  Stress: Not on file  Social Connections: Not on file     Family History: The patient's family history includes Heart attack in her father; Hypertension in her father and mother.  ROS:   Please see the history of present illness.    All other systems reviewed and are negative.  EKGs/Labs/Other Studies Reviewed:    The following studies were reviewed today: I discussed my findings with the patient at length EKG revealed sinus rhythm and nonspecific ST-T changes   Recent Labs: No results found for requested labs within last 365 days.  Recent Lipid Panel    Component Value Date/Time   CHOL 202 (H) 08/28/2020 0955   TRIG 115  08/28/2020 0955   HDL 35 (L) 08/28/2020 0955   CHOLHDL 5.8 (H) 08/28/2020 0955   LDLCALC 146 (H) 08/28/2020 0955    Physical Exam:    VS:  BP 106/68   Pulse 69   Ht 5\' 3"  (1.6 m)   Wt 156 lb (70.8 kg)   SpO2 95%   BMI 27.63 kg/m     Wt Readings from Last 3 Encounters:  04/03/23 156 lb (70.8 kg)  02/10/22 151 lb (68.5 kg)  08/28/20 162 lb (73.5 kg)     GEN: Patient is in no acute distress HEENT: Normal NECK: No JVD; No carotid bruits LYMPHATICS: No lymphadenopathy CARDIAC: Hear sounds regular, 2/6 systolic murmur at the apex. RESPIRATORY:  Clear to auscultation without rales, wheezing or rhonchi  ABDOMEN: Soft, non-tender, non-distended MUSCULOSKELETAL:  No edema; No deformity  SKIN: Warm and dry NEUROLOGIC:  Alert and oriented x 3 PSYCHIATRIC:  Normal affect   Signed, Garwin Brothers, MD  04/03/2023 8:40 AM    [facility]

## 2023-04-04 LAB — COMPREHENSIVE METABOLIC PANEL
ALT: 24 IU/L (ref 0–32)
AST: 18 IU/L (ref 0–40)
Albumin: 4.6 g/dL (ref 3.8–4.9)
Alkaline Phosphatase: 66 IU/L (ref 44–121)
BUN/Creatinine Ratio: 19 (ref 9–23)
BUN: 15 mg/dL (ref 6–24)
Bilirubin Total: 0.4 mg/dL (ref 0.0–1.2)
CO2: 20 mmol/L (ref 20–29)
Calcium: 9.6 mg/dL (ref 8.7–10.2)
Chloride: 105 mmol/L (ref 96–106)
Creatinine, Ser: 0.78 mg/dL (ref 0.57–1.00)
Globulin, Total: 2 g/dL (ref 1.5–4.5)
Glucose: 90 mg/dL (ref 70–99)
Potassium: 4.5 mmol/L (ref 3.5–5.2)
Sodium: 140 mmol/L (ref 134–144)
Total Protein: 6.6 g/dL (ref 6.0–8.5)
eGFR: 91 mL/min/{1.73_m2} (ref >59)

## 2023-04-04 LAB — LIPID PANEL
Chol/HDL Ratio: 5.4 ratio — ABNORMAL HIGH (ref 0.0–4.4)
Cholesterol, Total: 215 mg/dL — ABNORMAL HIGH (ref 100–199)
HDL: 40 mg/dL (ref >39)
LDL Chol Calc (NIH): 156 mg/dL — ABNORMAL HIGH (ref 0–99)
Triglycerides: 105 mg/dL (ref 0–149)
VLDL Cholesterol Cal: 19 mg/dL (ref 5–40)

## 2023-04-04 LAB — TSH: TSH: 1.44 u[IU]/mL (ref 0.450–4.500)

## 2023-04-06 ENCOUNTER — Telehealth: Payer: Self-pay

## 2023-04-06 DIAGNOSIS — E7800 Pure hypercholesterolemia, unspecified: Secondary | ICD-10-CM

## 2023-04-06 NOTE — Telephone Encounter (Signed)
-----   Message from Garwin Brothers, MD sent at 04/06/2023  8:41 AM EDT ----- Markedly elevated cholesterol.  Diet and exercise and recheck 2 months as discussed before.  Copy primary Garwin Brothers, MD 04/06/2023 8:40 AM

## 2023-04-07 ENCOUNTER — Other Ambulatory Visit (HOSPITAL_BASED_OUTPATIENT_CLINIC_OR_DEPARTMENT_OTHER): Payer: Self-pay

## 2023-04-07 ENCOUNTER — Other Ambulatory Visit: Payer: Self-pay | Admitting: Cardiology

## 2023-04-07 ENCOUNTER — Encounter: Payer: Self-pay | Admitting: Cardiology

## 2023-04-08 ENCOUNTER — Other Ambulatory Visit: Payer: Self-pay

## 2023-04-08 ENCOUNTER — Other Ambulatory Visit (HOSPITAL_BASED_OUTPATIENT_CLINIC_OR_DEPARTMENT_OTHER): Payer: Self-pay

## 2023-04-08 MED ORDER — CARVEDILOL 12.5 MG PO TABS
12.5000 mg | ORAL_TABLET | Freq: Two times a day (BID) | ORAL | 3 refills | Status: DC
  Filled 2023-04-08 (×2): qty 180, 90d supply, fill #0
  Filled 2023-07-02: qty 180, 90d supply, fill #1
  Filled 2023-09-30: qty 180, 90d supply, fill #2
  Filled 2023-12-29: qty 180, 90d supply, fill #3

## 2023-04-21 DIAGNOSIS — G8929 Other chronic pain: Secondary | ICD-10-CM | POA: Diagnosis not present

## 2023-04-21 DIAGNOSIS — M1711 Unilateral primary osteoarthritis, right knee: Secondary | ICD-10-CM | POA: Diagnosis not present

## 2023-04-21 DIAGNOSIS — M67813 Other specified disorders of tendon, right shoulder: Secondary | ICD-10-CM | POA: Diagnosis not present

## 2023-04-30 ENCOUNTER — Other Ambulatory Visit: Payer: Self-pay | Admitting: Cardiology

## 2023-04-30 ENCOUNTER — Other Ambulatory Visit (HOSPITAL_BASED_OUTPATIENT_CLINIC_OR_DEPARTMENT_OTHER): Payer: Self-pay

## 2023-04-30 MED ORDER — HYDROCHLOROTHIAZIDE 12.5 MG PO CAPS
12.5000 mg | ORAL_CAPSULE | Freq: Every day | ORAL | 3 refills | Status: DC
  Filled 2023-04-30: qty 90, 90d supply, fill #0
  Filled 2023-08-11: qty 90, 90d supply, fill #1
  Filled 2023-11-09: qty 90, 90d supply, fill #2
  Filled 2024-02-04: qty 90, 90d supply, fill #3

## 2023-04-30 MED ORDER — TELMISARTAN 40 MG PO TABS
40.0000 mg | ORAL_TABLET | Freq: Every day | ORAL | 3 refills | Status: DC
  Filled 2023-04-30: qty 90, 90d supply, fill #0
  Filled 2023-08-17: qty 90, 90d supply, fill #1

## 2023-06-08 ENCOUNTER — Other Ambulatory Visit (HOSPITAL_BASED_OUTPATIENT_CLINIC_OR_DEPARTMENT_OTHER): Payer: Self-pay

## 2023-06-08 DIAGNOSIS — L579 Skin changes due to chronic exposure to nonionizing radiation, unspecified: Secondary | ICD-10-CM | POA: Diagnosis not present

## 2023-06-08 DIAGNOSIS — L719 Rosacea, unspecified: Secondary | ICD-10-CM | POA: Diagnosis not present

## 2023-06-08 DIAGNOSIS — L821 Other seborrheic keratosis: Secondary | ICD-10-CM | POA: Diagnosis not present

## 2023-06-08 MED ORDER — METRONIDAZOLE 0.75 % EX CREA
1.0000 | TOPICAL_CREAM | Freq: Two times a day (BID) | CUTANEOUS | 6 refills | Status: AC
  Filled 2023-06-08: qty 45, 23d supply, fill #0
  Filled 2023-10-22: qty 45, 23d supply, fill #1
  Filled 2024-03-16 – 2024-03-28 (×2): qty 45, 23d supply, fill #2

## 2023-06-08 MED ORDER — TRETINOIN 0.025 % EX CREA
1.0000 "application " | TOPICAL_CREAM | Freq: Every day | CUTANEOUS | 3 refills | Status: AC
  Filled 2023-06-08: qty 20, 30d supply, fill #0
  Filled 2023-10-22: qty 20, 30d supply, fill #1
  Filled 2024-03-29: qty 20, 30d supply, fill #2

## 2023-07-14 ENCOUNTER — Ambulatory Visit: Payer: Commercial Managed Care - PPO | Admitting: Podiatry

## 2023-07-17 ENCOUNTER — Other Ambulatory Visit (HOSPITAL_BASED_OUTPATIENT_CLINIC_OR_DEPARTMENT_OTHER): Payer: Self-pay | Admitting: Physician Assistant

## 2023-07-17 ENCOUNTER — Other Ambulatory Visit (HOSPITAL_BASED_OUTPATIENT_CLINIC_OR_DEPARTMENT_OTHER): Payer: Self-pay | Admitting: Obstetrics and Gynecology

## 2023-07-17 DIAGNOSIS — Z1231 Encounter for screening mammogram for malignant neoplasm of breast: Secondary | ICD-10-CM

## 2023-07-23 DIAGNOSIS — S83241D Other tear of medial meniscus, current injury, right knee, subsequent encounter: Secondary | ICD-10-CM | POA: Diagnosis not present

## 2023-07-23 DIAGNOSIS — M25561 Pain in right knee: Secondary | ICD-10-CM | POA: Diagnosis not present

## 2023-07-23 DIAGNOSIS — G8929 Other chronic pain: Secondary | ICD-10-CM | POA: Diagnosis not present

## 2023-08-01 DIAGNOSIS — M660 Rupture of popliteal cyst: Secondary | ICD-10-CM | POA: Diagnosis not present

## 2023-08-01 DIAGNOSIS — S83231A Complex tear of medial meniscus, current injury, right knee, initial encounter: Secondary | ICD-10-CM | POA: Diagnosis not present

## 2023-08-04 ENCOUNTER — Other Ambulatory Visit (HOSPITAL_BASED_OUTPATIENT_CLINIC_OR_DEPARTMENT_OTHER): Payer: Self-pay

## 2023-08-04 DIAGNOSIS — R300 Dysuria: Secondary | ICD-10-CM | POA: Diagnosis not present

## 2023-08-04 DIAGNOSIS — N390 Urinary tract infection, site not specified: Secondary | ICD-10-CM | POA: Diagnosis not present

## 2023-08-04 MED ORDER — NITROFURANTOIN MONOHYD MACRO 100 MG PO CAPS
100.0000 mg | ORAL_CAPSULE | Freq: Two times a day (BID) | ORAL | 0 refills | Status: DC
  Filled 2023-08-04: qty 20, 10d supply, fill #0

## 2023-08-17 ENCOUNTER — Other Ambulatory Visit (HOSPITAL_BASED_OUTPATIENT_CLINIC_OR_DEPARTMENT_OTHER): Payer: Self-pay

## 2023-08-18 ENCOUNTER — Ambulatory Visit (HOSPITAL_BASED_OUTPATIENT_CLINIC_OR_DEPARTMENT_OTHER)
Admission: RE | Admit: 2023-08-18 | Discharge: 2023-08-18 | Disposition: A | Discharge status: Home / Self Care | Payer: Commercial Managed Care - PPO | Source: Ambulatory Visit | Attending: Obstetrics and Gynecology | Admitting: Obstetrics and Gynecology

## 2023-08-18 ENCOUNTER — Encounter (HOSPITAL_BASED_OUTPATIENT_CLINIC_OR_DEPARTMENT_OTHER): Payer: Self-pay

## 2023-08-18 DIAGNOSIS — G8929 Other chronic pain: Secondary | ICD-10-CM | POA: Diagnosis not present

## 2023-08-18 DIAGNOSIS — Z1231 Encounter for screening mammogram for malignant neoplasm of breast: Secondary | ICD-10-CM | POA: Insufficient documentation

## 2023-08-18 DIAGNOSIS — M25561 Pain in right knee: Secondary | ICD-10-CM | POA: Diagnosis not present

## 2023-08-20 ENCOUNTER — Ambulatory Visit (HOSPITAL_BASED_OUTPATIENT_CLINIC_OR_DEPARTMENT_OTHER): Payer: Commercial Managed Care - PPO

## 2023-08-27 ENCOUNTER — Inpatient Hospital Stay (HOSPITAL_BASED_OUTPATIENT_CLINIC_OR_DEPARTMENT_OTHER): Admission: RE | Admit: 2023-08-27 | Payer: Commercial Managed Care - PPO | Source: Ambulatory Visit

## 2023-08-27 ENCOUNTER — Encounter (HOSPITAL_BASED_OUTPATIENT_CLINIC_OR_DEPARTMENT_OTHER): Payer: Self-pay

## 2023-08-29 DIAGNOSIS — D539 Nutritional anemia, unspecified: Secondary | ICD-10-CM | POA: Diagnosis not present

## 2023-08-29 DIAGNOSIS — Z79899 Other long term (current) drug therapy: Secondary | ICD-10-CM | POA: Diagnosis not present

## 2023-08-29 DIAGNOSIS — R300 Dysuria: Secondary | ICD-10-CM | POA: Diagnosis not present

## 2023-08-31 ENCOUNTER — Other Ambulatory Visit: Payer: Self-pay | Admitting: Physician Assistant

## 2023-08-31 DIAGNOSIS — L040 Acute lymphadenitis of face, head and neck: Secondary | ICD-10-CM

## 2023-08-31 DIAGNOSIS — R319 Hematuria, unspecified: Secondary | ICD-10-CM

## 2023-09-01 ENCOUNTER — Encounter: Payer: Self-pay | Admitting: Cardiology

## 2023-09-01 DIAGNOSIS — E7800 Pure hypercholesterolemia, unspecified: Secondary | ICD-10-CM

## 2023-09-01 DIAGNOSIS — E782 Mixed hyperlipidemia: Secondary | ICD-10-CM

## 2023-09-02 ENCOUNTER — Other Ambulatory Visit (HOSPITAL_BASED_OUTPATIENT_CLINIC_OR_DEPARTMENT_OTHER): Payer: Self-pay | Admitting: Physician Assistant

## 2023-09-02 ENCOUNTER — Other Ambulatory Visit: Payer: Self-pay

## 2023-09-02 ENCOUNTER — Other Ambulatory Visit (HOSPITAL_BASED_OUTPATIENT_CLINIC_OR_DEPARTMENT_OTHER): Payer: Self-pay

## 2023-09-02 ENCOUNTER — Ambulatory Visit (HOSPITAL_BASED_OUTPATIENT_CLINIC_OR_DEPARTMENT_OTHER)
Admission: RE | Admit: 2023-09-02 | Discharge: 2023-09-02 | Disposition: A | Discharge status: Home / Self Care | Payer: Commercial Managed Care - PPO | Source: Ambulatory Visit | Attending: Physician Assistant | Admitting: Physician Assistant

## 2023-09-02 DIAGNOSIS — R748 Abnormal levels of other serum enzymes: Secondary | ICD-10-CM | POA: Insufficient documentation

## 2023-09-02 DIAGNOSIS — L040 Acute lymphadenitis of face, head and neck: Secondary | ICD-10-CM

## 2023-09-02 MED ORDER — TELMISARTAN 80 MG PO TABS
80.0000 mg | ORAL_TABLET | Freq: Every day | ORAL | 3 refills | Status: DC
  Filled 2023-09-02: qty 90, 90d supply, fill #0
  Filled 2024-03-29: qty 90, 90d supply, fill #1
  Filled 2024-06-23: qty 90, 90d supply, fill #2

## 2023-09-03 ENCOUNTER — Ambulatory Visit (HOSPITAL_BASED_OUTPATIENT_CLINIC_OR_DEPARTMENT_OTHER)
Admission: RE | Admit: 2023-09-03 | Discharge: 2023-09-03 | Disposition: A | Discharge status: Home / Self Care | Payer: Commercial Managed Care - PPO | Source: Ambulatory Visit | Attending: Physician Assistant | Admitting: Physician Assistant

## 2023-09-03 ENCOUNTER — Ambulatory Visit (HOSPITAL_BASED_OUTPATIENT_CLINIC_OR_DEPARTMENT_OTHER)
Admission: RE | Admit: 2023-09-03 | Discharge: 2023-09-03 | Disposition: A | Discharge status: Home / Self Care | Payer: Commercial Managed Care - PPO | Source: Ambulatory Visit | Attending: Obstetrics and Gynecology | Admitting: Obstetrics and Gynecology

## 2023-09-03 ENCOUNTER — Encounter (HOSPITAL_BASED_OUTPATIENT_CLINIC_OR_DEPARTMENT_OTHER): Payer: Self-pay

## 2023-09-03 DIAGNOSIS — Z9049 Acquired absence of other specified parts of digestive tract: Secondary | ICD-10-CM | POA: Diagnosis not present

## 2023-09-03 DIAGNOSIS — R748 Abnormal levels of other serum enzymes: Secondary | ICD-10-CM

## 2023-09-03 DIAGNOSIS — R599 Enlarged lymph nodes, unspecified: Secondary | ICD-10-CM | POA: Diagnosis not present

## 2023-09-03 DIAGNOSIS — R809 Proteinuria, unspecified: Secondary | ICD-10-CM | POA: Diagnosis not present

## 2023-09-03 DIAGNOSIS — L040 Acute lymphadenitis of face, head and neck: Secondary | ICD-10-CM | POA: Diagnosis not present

## 2023-09-03 DIAGNOSIS — Z1231 Encounter for screening mammogram for malignant neoplasm of breast: Secondary | ICD-10-CM | POA: Insufficient documentation

## 2023-09-03 DIAGNOSIS — R319 Hematuria, unspecified: Secondary | ICD-10-CM | POA: Diagnosis not present

## 2023-09-04 ENCOUNTER — Other Ambulatory Visit (HOSPITAL_BASED_OUTPATIENT_CLINIC_OR_DEPARTMENT_OTHER): Payer: Self-pay

## 2023-09-04 ENCOUNTER — Ambulatory Visit (HOSPITAL_BASED_OUTPATIENT_CLINIC_OR_DEPARTMENT_OTHER): Admission: RE | Admit: 2023-09-04 | Payer: Commercial Managed Care - PPO | Source: Ambulatory Visit

## 2023-09-04 MED ORDER — DEXAMETHASONE 1 MG PO TABS
ORAL_TABLET | ORAL | 0 refills | Status: DC
  Filled 2023-09-04: qty 1, 1d supply, fill #0

## 2023-09-07 DIAGNOSIS — R030 Elevated blood-pressure reading, without diagnosis of hypertension: Secondary | ICD-10-CM | POA: Diagnosis not present

## 2023-09-07 DIAGNOSIS — R5383 Other fatigue: Secondary | ICD-10-CM | POA: Diagnosis not present

## 2023-09-14 ENCOUNTER — Other Ambulatory Visit (HOSPITAL_BASED_OUTPATIENT_CLINIC_OR_DEPARTMENT_OTHER): Payer: Commercial Managed Care - PPO

## 2023-09-14 DIAGNOSIS — K580 Irritable bowel syndrome with diarrhea: Secondary | ICD-10-CM | POA: Diagnosis not present

## 2023-09-14 DIAGNOSIS — K644 Residual hemorrhoidal skin tags: Secondary | ICD-10-CM | POA: Diagnosis not present

## 2023-09-19 DIAGNOSIS — E049 Nontoxic goiter, unspecified: Secondary | ICD-10-CM | POA: Diagnosis not present

## 2023-09-23 DIAGNOSIS — N926 Irregular menstruation, unspecified: Secondary | ICD-10-CM | POA: Diagnosis not present

## 2023-09-23 DIAGNOSIS — Z113 Encounter for screening for infections with a predominantly sexual mode of transmission: Secondary | ICD-10-CM | POA: Diagnosis not present

## 2023-09-23 DIAGNOSIS — N930 Postcoital and contact bleeding: Secondary | ICD-10-CM | POA: Diagnosis not present

## 2023-09-23 DIAGNOSIS — Z124 Encounter for screening for malignant neoplasm of cervix: Secondary | ICD-10-CM | POA: Diagnosis not present

## 2023-09-23 DIAGNOSIS — R102 Pelvic and perineal pain: Secondary | ICD-10-CM | POA: Diagnosis not present

## 2023-09-24 ENCOUNTER — Other Ambulatory Visit (HOSPITAL_BASED_OUTPATIENT_CLINIC_OR_DEPARTMENT_OTHER): Payer: Self-pay

## 2023-09-24 MED ORDER — DOXYCYCLINE HYCLATE 100 MG PO TABS
100.0000 mg | ORAL_TABLET | Freq: Two times a day (BID) | ORAL | 0 refills | Status: DC
  Filled 2023-09-24: qty 20, 10d supply, fill #0

## 2023-10-05 ENCOUNTER — Other Ambulatory Visit (HOSPITAL_BASED_OUTPATIENT_CLINIC_OR_DEPARTMENT_OTHER): Payer: Self-pay

## 2023-10-05 MED ORDER — MISOPROSTOL 200 MCG PO TABS
200.0000 ug | ORAL_TABLET | Freq: Every day | ORAL | 0 refills | Status: DC
  Filled 2023-10-05: qty 2, 2d supply, fill #0

## 2023-10-08 ENCOUNTER — Other Ambulatory Visit (HOSPITAL_BASED_OUTPATIENT_CLINIC_OR_DEPARTMENT_OTHER): Payer: Self-pay

## 2023-10-14 ENCOUNTER — Other Ambulatory Visit: Payer: Self-pay | Admitting: Medical Genetics

## 2023-10-29 ENCOUNTER — Other Ambulatory Visit (HOSPITAL_BASED_OUTPATIENT_CLINIC_OR_DEPARTMENT_OTHER): Payer: Self-pay

## 2023-10-29 ENCOUNTER — Telehealth: Payer: Commercial Managed Care - PPO | Admitting: Family Medicine

## 2023-10-29 DIAGNOSIS — B9689 Other specified bacterial agents as the cause of diseases classified elsewhere: Secondary | ICD-10-CM | POA: Diagnosis not present

## 2023-10-29 DIAGNOSIS — J4521 Mild intermittent asthma with (acute) exacerbation: Secondary | ICD-10-CM | POA: Diagnosis not present

## 2023-10-29 DIAGNOSIS — J019 Acute sinusitis, unspecified: Secondary | ICD-10-CM | POA: Diagnosis not present

## 2023-10-29 MED ORDER — ALBUTEROL SULFATE HFA 108 (90 BASE) MCG/ACT IN AERS
2.0000 | INHALATION_SPRAY | RESPIRATORY_TRACT | 0 refills | Status: AC | PRN
  Filled 2023-10-29: qty 6.7, 19d supply, fill #0

## 2023-10-29 MED ORDER — TRELEGY ELLIPTA 100-62.5-25 MCG/ACT IN AEPB
1.0000 | INHALATION_SPRAY | Freq: Every day | RESPIRATORY_TRACT | 0 refills | Status: AC
  Filled 2023-10-29 – 2023-11-09 (×2): qty 60, 30d supply, fill #0

## 2023-10-29 MED ORDER — AZITHROMYCIN 250 MG PO TABS
ORAL_TABLET | ORAL | 0 refills | Status: AC
  Filled 2023-10-29: qty 6, 5d supply, fill #0

## 2023-10-29 NOTE — Patient Instructions (Addendum)
Lindsay Burke, thank you for joining [name], NP for today's virtual visit.  While this provider is not your primary care provider (PCP), if your PCP is located in our provider database this encounter information will be shared with them immediately following your visit.   A [facility] MyChart account gives you access to today's visit and all your visits, tests, and labs performed at [facility] " click here if you don't have a [facility] MyChart account or go to [email or web address]  Consent: (Patient) Lindsay Burke provided verbal consent for this virtual visit at the beginning of the encounter.  Current Medications:  Current Outpatient Medications:    acetaminophen (TYLENOL) 500 MG tablet, Take 1 tablet by mouth every 4 (four) hours as needed for mild pain or fever., Disp: , Rfl:    albuterol (VENTOLIN HFA) 108 (90 Base) MCG/ACT inhaler, Inhale 2 puffs into the lungs every 4 (four) hours as needed for wheezing or shortness of breath., Disp: 8 g, Rfl: 0   azithromycin (ZITHROMAX) 250 MG tablet, Take 2 tablets on day 1, then 1 tablet daily on days 2 through 5, Disp: 6 tablet, Rfl: 0   carvedilol (COREG) 12.5 MG tablet, Take 1 tablet (12.5 mg total) by mouth 2 (two) times daily with a meal., Disp: 180 tablet, Rfl: 3   cetirizine (ZYRTEC) 10 MG tablet, Take 10 mg by mouth daily. 0.5 TABLET AT BEDTIME, Disp: , Rfl:    dexamethasone (DECADRON) 1 MG tablet, Take 1 tablet between 11pm and 12 midnight prior to labs on the next day, Disp: 1 tablet, Rfl: 0   diclofenac Sodium (VOLTAREN) 1 % GEL, Apply 4 g topically 4 times daily for 30 days., Disp: 500 g, Rfl: 0   fluticasone (FLONASE) 50 MCG/ACT nasal spray, SPRAY 1 SPRAY INTO EACH NOSTRIL EVERY DAY, Disp: , Rfl:    Fluticasone-Umeclidin-Vilant (TRELEGY ELLIPTA) 100-62.5-25 MCG/ACT AEPB, Inhale 1 puff into the lungs daily., Disp: 60 each, Rfl: 0   hydrochlorothiazide (MICROZIDE) 12.5 MG capsule, Take 1 capsule (12.5 mg total) by  mouth daily., Disp: 90 capsule, Rfl: 3   hydrocortisone (ANUSOL-HC) 25 MG suppository, Place 1 suppository (25 mg total) rectally 2 (two) times daily for 10 days., Disp: 20 suppository, Rfl: 0   metroNIDAZOLE (METROCREAM) 0.75 % cream, Apply to the face on to the skin 2 times daily, Disp: 45 g, Rfl: 2   metroNIDAZOLE (METROCREAM) 0.75 % cream, Apply to affected area(s) of the face twice daily, Disp: 45 g, Rfl: 6   misoprostol (CYTOTEC) 200 MCG tablet, Take this medication for 2 nights before surgery and let dissolve between the cheek and gum., Disp: 2 tablet, Rfl: 0   norethindrone (AYGESTIN) 5 MG tablet, Take 1 tablet (5 mg total) by mouth daily., Disp: 90 tablet, Rfl: 3   Semaglutide-Weight Management (WEGOVY) 1.7 MG/0.75ML SOAJ, Inject 1.7 mg into the skin every 7 (seven) days., Disp: 3 mL, Rfl: 0   Semaglutide-Weight Management (WEGOVY) 2.4 MG/0.75ML SOAJ, Inject 2.4 mg into the skin every 7 (seven) days., Disp: 3 mL, Rfl: 1   telmisartan (MICARDIS) 80 MG tablet, Take 1 tablet (80 mg total) by mouth daily., Disp: 90 tablet, Rfl: 3   tretinoin (RETIN-A) 0.025 % cream, Apply a pea sized amount to the face at bedtime, Disp: 20 g, Rfl: 3   tretinoin (RETIN-A) 0.025 % cream, Apply a pea-sized amount topically to entire face at bedtime., Disp: 20 g, Rfl: 3   Medications ordered in this encounter:  Meds ordered this  encounter  Medications   azithromycin (ZITHROMAX) 250 MG tablet    Sig: Take 2 tablets on day 1, then 1 tablet daily on days 2 through 5    Dispense:  6 tablet    Refill:  0    Supervising Provider:   Merrilee Jansky [[id number]]   albuterol (VENTOLIN HFA) 108 (90 Base) MCG/ACT inhaler    Sig: Inhale 2 puffs into the lungs every 4 (four) hours as needed for wheezing or shortness of breath.    Dispense:  8 g    Refill:  0    Supervising Provider:   Merrilee Jansky [[id number]]   Fluticasone-Umeclidin-Vilant (TRELEGY ELLIPTA) 100-62.5-25 MCG/ACT AEPB    Sig: Inhale 1 puff into the  lungs daily.    Dispense:  60 each    Refill:  0    Supervising Provider:   Merrilee Jansky [[id number]]     *If you need refills on other medications prior to your next appointment, please contact your pharmacy*  Follow-Up: Call back or seek an in-person evaluation if the symptoms worsen or if the condition fails to improve as anticipated.  [facility] [phone]  Other Instructions  -Take meds as prescribed -Rest -Use a cool mist humidifier especially during the winter months when heat dries out the air. - Use saline nose sprays frequently to help soothe nasal passages and promote drainage. -Saline irrigations of the nose can be very helpful if done frequently.             * 4X daily for 1 week*             * Use of a nettie pot can be helpful with this.  *Follow directions with this* *Boiled or distilled water only -stay hydrated by drinking plenty of fluids - Keep thermostat turn down low to prevent drying out sinuses - For any cough or congestion- robitussin DM or Delsym as needed - For fever or aches or pains- take tylenol or ibuprofen as directed on bottle             * for fevers greater than 101 orally you may alternate ibuprofen and tylenol every 3 hours.  If you do not improve you will need a follow up visit in person.                   If you have been instructed to have an in-person evaluation today at a local Urgent Care facility, please use the link below. It will take you to a list of all of our available [facility], including address, phone number and hours of operation. Please do not delay care.  [facility]  If you or a family member do not have a primary care provider, use the link below to schedule a visit and establish care. When you choose a [place] primary care physician or advanced practice provider, you gain a long-term partner in health. Find a Primary Care Provider  Learn more about [facility]  in-office and virtual care options: [facility] - Get Care Now

## 2023-10-29 NOTE — Progress Notes (Signed)
Virtual Visit Consent   Lindsay Burke, you are scheduled for a virtual visit with a [facility] provider today. Just as with appointments in the office, your consent must be obtained to participate. Your consent will be active for this visit and any virtual visit you may have with one of our providers in the next 365 days. If you have a MyChart account, a copy of this consent can be sent to you electronically.  As this is a virtual visit, video technology does not allow for your provider to perform a traditional examination. This may limit your provider's ability to fully assess your condition. If your provider identifies any concerns that need to be evaluated in person or the need to arrange testing (such as labs, EKG, etc.), we will make arrangements to do so. Although advances in technology are sophisticated, we cannot ensure that it will always work on either your end or our end. If the connection with a video visit is poor, the visit may have to be switched to a telephone visit. With either a video or telephone visit, we are not always able to ensure that we have a secure connection.  By engaging in this virtual visit, you consent to the provision of healthcare and authorize for your insurance to be billed (if applicable) for the services provided during this visit. Depending on your insurance coverage, you may receive a charge related to this service.  I need to obtain your verbal consent now. Are you willing to proceed with your visit today? Lindsay Burke has provided verbal consent on 10/29/2023 for a virtual visit (video or telephone). [name], NP  Date: 10/29/2023 10:04 AM  Virtual Visit via Video Note   I, [name], connected with  Lindsay Burke  ([id number], Oct 15, 1970) on 10/29/23 at 10:00 AM EST by a video-enabled telemedicine application and verified that I am speaking with the correct person using two identifiers.  Location: Patient: Virtual Visit Location Patient:  Home Provider: Virtual Visit Location Provider: Home Office   I discussed the limitations of evaluation and management by telemedicine and the availability of in person appointments. The patient expressed understanding and agreed to proceed.    History of Present Illness: Lindsay Burke is a 53 y.o. who identifies as a female who was assigned female at birth, and is being seen today for sinus infection.  Onset was 3 weeks ago Congestion, sinus pressure, asthma flaring up- coughing (calming down with some trelegy and using rescue often- coughing up thick mucus (swallowing what she gets up).  Problems:  Patient Active Problem List   Diagnosis Date Noted   Mixed dyslipidemia 04/03/2023   Moderate mitral regurgitation 04/03/2023   Hyperlipidemia 04/03/2023   Family history of coronary artery disease 04/03/2023   Overweight (BMI 25.0-29.9) 04/03/2023   Hypertension 04/02/2023   Asthma 08/22/2021   Palpitations 08/28/2020   Dyspnea due to COVID-19 12/21/2019   COVID-19 10/25/2019   Essential hypertension 09/05/2019   Malaise and fatigue 09/05/2019   Cardiac risk counseling 09/05/2019   Snores 09/05/2019   Mild intermittent asthma with acute exacerbation 07/09/2018   Vitamin D deficiency 07/09/2018   Fibrocystic breast changes, left 11/09/2017   Hashimoto's disease 06/19/2017   Abnormal uterine bleeding 03/12/2016   History of endometrial ablation 03/12/2016   Menorrhagia with irregular cycle 03/12/2016   Thyroid disorder 03/12/2016   Postcoital bleeding 03/12/2016   Abnormality of left breast on screening mammogram 12/03/2015   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   Protrusion  of intervertebral disc of lumbosacral region 02/21/2015   Right-sided low back pain without sciatica 01/30/2015   SI (sacroiliac) joint dysfunction 01/30/2015   History of renal stone 03/08/2014   H/O knee surgery 03/08/2014   Hx of cholecystectomy 03/08/2014   Fatigue 01/18/2014    Multinodular goiter 01/18/2014    Allergies:  Allergies  Allergen Reactions   Acetaminophen Hypertension, Other (See Comments) and Rash    Tylenol arthritis puts into CHF    Clindamycin/Lincomycin Diarrhea   Hydralazine Shortness Of Breath and Swelling   Phentermine Palpitations and Hypertension   Chlorthalidone Hypertension        Nifedipine Rash and Hypertension   Clonidine Other (See Comments)    NIGHTMARES, SLEEPY   Spironolactone Hypertension   Zetia [Ezetimibe] Other (See Comments)    Muscle pain   Aleve [Naproxen] Palpitations    HR 120   Cephalexin Rash   Medications:  Current Outpatient Medications:    acetaminophen (TYLENOL) 500 MG tablet, Take 1 tablet by mouth every 4 (four) hours as needed for mild pain or fever., Disp: , Rfl:    albuterol (VENTOLIN HFA) 108 (90 Base) MCG/ACT inhaler, Inhale 2 puffs into the lungs every 4 (four) hours as needed for wheezing or shortness of breath., Disp: , Rfl:    carvedilol (COREG) 12.5 MG tablet, Take 1 tablet (12.5 mg total) by mouth 2 (two) times daily with a meal., Disp: 180 tablet, Rfl: 3   cetirizine (ZYRTEC) 10 MG tablet, Take 10 mg by mouth daily. 0.5 TABLET AT BEDTIME, Disp: , Rfl:    dexamethasone (DECADRON) 1 MG tablet, Take 1 tablet between 11pm and 12 midnight prior to labs on the next day, Disp: 1 tablet, Rfl: 0   diclofenac Sodium (VOLTAREN) 1 % GEL, Apply 4 g topically 4 times daily for 30 days., Disp: 500 g, Rfl: 0   doxycycline (VIBRA-TABS) 100 MG tablet, Take 1 tablet (100 mg total) by mouth 2 (two) times daily. Take with 8 oz of water. Do not lie down for at least 30 minutes after., Disp: 20 tablet, Rfl: 0   fluticasone (FLONASE) 50 MCG/ACT nasal spray, SPRAY 1 SPRAY INTO EACH NOSTRIL EVERY DAY, Disp: , Rfl:    Fluticasone-Umeclidin-Vilant (TRELEGY ELLIPTA) 100-62.5-25 MCG/ACT AEPB, Inhale 1 puff into the lungs daily. (Patient taking differently: Inhale 1 puff into the lungs as needed (asthma flare up).), Disp: 60  each, Rfl: 1   hydrochlorothiazide (MICROZIDE) 12.5 MG capsule, Take 1 capsule (12.5 mg total) by mouth daily., Disp: 90 capsule, Rfl: 3   hydrocortisone (ANUSOL-HC) 25 MG suppository, Place 1 suppository (25 mg total) rectally 2 (two) times daily for 10 days., Disp: 20 suppository, Rfl: 0   metroNIDAZOLE (METROCREAM) 0.75 % cream, Apply to the face on to the skin 2 times daily, Disp: 45 g, Rfl: 2   metroNIDAZOLE (METROCREAM) 0.75 % cream, Apply to affected area(s) of the face twice daily, Disp: 45 g, Rfl: 6   misoprostol (CYTOTEC) 200 MCG tablet, Take this medication for 2 nights before surgery and let dissolve between the cheek and gum., Disp: 2 tablet, Rfl: 0   nitrofurantoin, macrocrystal-monohydrate, (MACROBID) 100 MG capsule, Take 1 capsule (100 mg total) by mouth 2 (two) times daily for 10 days., Disp: 20 capsule, Rfl: 0   norethindrone (AYGESTIN) 5 MG tablet, Take 1 tablet (5 mg total) by mouth daily., Disp: 90 tablet, Rfl: 3   Semaglutide-Weight Management (WEGOVY) 1.7 MG/0.75ML SOAJ, Inject 1.7 mg into the skin every 7 (seven) days., Disp:  3 mL, Rfl: 0   Semaglutide-Weight Management (WEGOVY) 2.4 MG/0.75ML SOAJ, Inject 2.4 mg into the skin every 7 (seven) days., Disp: 3 mL, Rfl: 1   telmisartan (MICARDIS) 80 MG tablet, Take 1 tablet (80 mg total) by mouth daily., Disp: 90 tablet, Rfl: 3   tretinoin (RETIN-A) 0.025 % cream, Apply a pea sized amount to the face at bedtime, Disp: 20 g, Rfl: 3   tretinoin (RETIN-A) 0.025 % cream, Apply a pea-sized amount topically to entire face at bedtime., Disp: 20 g, Rfl: 3  Observations/Objective: Patient is well-developed, well-nourished in no acute distress.  Resting comfortably  at home.  Head is normocephalic, atraumatic.  No labored breathing.  Speech is clear and coherent with logical content.  Patient is alert and oriented at baseline.    Assessment and Plan:  1. Acute bacterial sinusitis (Primary)  - azithromycin (ZITHROMAX) 250 MG  tablet; Take 2 tablets on day 1, then 1 tablet daily on days 2 through 5  Dispense: 6 tablet; Refill: 0  2. Mild intermittent asthma with exacerbation  - albuterol (VENTOLIN HFA) 108 (90 Base) MCG/ACT inhaler; Inhale 2 puffs into the lungs every 4 (four) hours as needed for wheezing or shortness of breath.  Dispense: 8 g; Refill: 0 - Fluticasone-Umeclidin-Vilant (TRELEGY ELLIPTA) 100-62.5-25 MCG/ACT AEPB; Inhale 1 puff into the lungs daily.  Dispense: 60 each; Refill: 0  -Take meds as prescribed -Rest -Use a cool mist humidifier especially during the winter months when heat dries out the air. - Use saline nose sprays frequently to help soothe nasal passages and promote drainage. -Saline irrigations of the nose can be very helpful if done frequently.             * 4X daily for 1 week*             * Use of a nettie pot can be helpful with this.  *Follow directions with this* *Boiled or distilled water only -stay hydrated by drinking plenty of fluids - Keep thermostat turn down low to prevent drying out sinuses - For any cough or congestion- robitussin DM or Delsym as needed - For fever or aches or pains- take tylenol or ibuprofen as directed on bottle             * for fevers greater than 101 orally you may alternate ibuprofen and tylenol every 3 hours.  If you do not improve you will need a follow up visit in person.                 Reviewed side effects, risks and benefits of medication.    Patient acknowledged agreement and understanding of the plan.   Past Medical, Surgical, Social History, Allergies, and Medications have been Reviewed.    Follow Up Instructions: I discussed the assessment and treatment plan with the patient. The patient was provided an opportunity to ask questions and all were answered. The patient agreed with the plan and demonstrated an understanding of the instructions.  A copy of instructions were sent to the patient via MyChart unless otherwise noted  below.     The patient was advised to call back or seek an in-person evaluation if the symptoms worsen or if the condition fails to improve as anticipated.    [name], NP

## 2023-11-09 ENCOUNTER — Other Ambulatory Visit (HOSPITAL_BASED_OUTPATIENT_CLINIC_OR_DEPARTMENT_OTHER): Payer: Self-pay

## 2023-11-10 ENCOUNTER — Other Ambulatory Visit (HOSPITAL_COMMUNITY): Payer: Self-pay

## 2023-11-10 ENCOUNTER — Other Ambulatory Visit (HOSPITAL_BASED_OUTPATIENT_CLINIC_OR_DEPARTMENT_OTHER): Payer: Self-pay

## 2023-11-13 ENCOUNTER — Other Ambulatory Visit (HOSPITAL_BASED_OUTPATIENT_CLINIC_OR_DEPARTMENT_OTHER): Payer: Self-pay

## 2023-11-13 MED ORDER — ONDANSETRON 4 MG PO TBDP
4.0000 mg | ORAL_TABLET | Freq: Three times a day (TID) | ORAL | 0 refills | Status: DC | PRN
  Filled 2023-11-13: qty 10, 4d supply, fill #0

## 2023-11-13 MED ORDER — TRAMADOL HCL 50 MG PO TABS
50.0000 mg | ORAL_TABLET | Freq: Three times a day (TID) | ORAL | 0 refills | Status: DC | PRN
  Filled 2023-11-18: qty 21, 7d supply, fill #0

## 2023-11-18 ENCOUNTER — Other Ambulatory Visit (HOSPITAL_BASED_OUTPATIENT_CLINIC_OR_DEPARTMENT_OTHER): Payer: Self-pay

## 2023-11-18 DIAGNOSIS — S83231A Complex tear of medial meniscus, current injury, right knee, initial encounter: Secondary | ICD-10-CM | POA: Diagnosis not present

## 2023-11-18 DIAGNOSIS — S83241A Other tear of medial meniscus, current injury, right knee, initial encounter: Secondary | ICD-10-CM | POA: Diagnosis not present

## 2023-11-19 ENCOUNTER — Encounter: Payer: Self-pay | Admitting: Pharmacist Clinician (PhC)/ Clinical Pharmacy Specialist

## 2023-11-19 ENCOUNTER — Ambulatory Visit
Payer: Commercial Managed Care - PPO | Attending: Cardiovascular Disease | Admitting: Pharmacist Clinician (PhC)/ Clinical Pharmacy Specialist

## 2023-11-19 ENCOUNTER — Other Ambulatory Visit (HOSPITAL_BASED_OUTPATIENT_CLINIC_OR_DEPARTMENT_OTHER): Payer: Self-pay

## 2023-11-19 DIAGNOSIS — E7800 Pure hypercholesterolemia, unspecified: Secondary | ICD-10-CM

## 2023-11-19 MED ORDER — ROSUVASTATIN CALCIUM 5 MG PO TABS
5.0000 mg | ORAL_TABLET | ORAL | 3 refills | Status: DC
Start: 2023-11-19 — End: 2023-12-02
  Filled 2023-11-19: qty 24, 84d supply, fill #0

## 2023-11-19 NOTE — Assessment & Plan Note (Signed)
 Assessment: Patient with family history of ASCVD not at LDL goal of < 100 Most recent LDL 156 on 04/03/23 Not able to tolerate ezetimibe  secondary to myalgias Reviewed options for lowering LDL cholesterol, including statins and PCSK9.  Explained that most plans will not cover Repatha  until try/fail 2 different statins.    Plan: Patient agreeable to starting rosuvastatin  5 mg twice weekly Get lipid labs this week to determine current baseline.  Repeat labs after 8 weeks on statin.   Lipid Liver function

## 2023-11-19 NOTE — Progress Notes (Signed)
 Office Visit    Patient Name: Lindsay Burke Date of Encounter: 11/19/2023  Primary Care Provider:  Wilfrid Dedra Maus, PA-C Primary Cardiologist:  None  Chief Complaint    Hyperlipidemia   Significant Past Medical History   HTN Controlled, on carvedilol , hctz, telmisartan    asthma Since Covid, on Trelegy Ellipta , albuterol      Allergies  Allergen Reactions   Acetaminophen Hypertension, Other (See Comments) and Rash    Tylenol arthritis puts into CHF    Clindamycin/Lincomycin Diarrhea   Hydralazine  Shortness Of Breath and Swelling   Phentermine Palpitations and Hypertension   Chlorthalidone Hypertension        Nifedipine Rash and Hypertension   Clonidine Other (See Comments)    NIGHTMARES, SLEEPY   Spironolactone  Hypertension   Zetia  [Ezetimibe ] Other (See Comments)    Muscle pain   Aleve [Naproxen] Palpitations    HR 120   Cephalexin  Rash    History of Present Illness    Lindsay Burke is a 53 y.o. female patient of Dr Edwyna, in the office today to discuss options for cholesterol management.  She has never taken a statin, has been concerned about risk of myalgias, which she did develop with ezetimibe .  Because of her father's early death from MI, she is very concerned about keeping her LDL as low as possible.   Insurance Carrier: [employer] - [facility]  LDL Cholesterol goal:  LDL < 100  Current Medications:   none  Previously tried: ezetimibe  10 mg - myalgias  Family Hx: father died from MI at 67, both parents had hypertension  Social Hx: Tobacco: no Alcohol: no     Diet:    more home cooked, does eat out sometimes on weekends, no fast foods, prefers salmon and vegetables  Exercise: PT with knee, gym otherwise 60-90 min   Accessory Clinical Findings   Lab Results  Component Value Date   CHOL 215 (H) 04/03/2023   HDL 40 04/03/2023   LDLCALC 156 (H) 04/03/2023   TRIG 105 04/03/2023   CHOLHDL 5.4 (H) 04/03/2023    Lipoprotein (a)  Date/Time Value Ref  Range Status  09/13/2019 10:30 AM 18.5 <75.0 nmol/L Final    Comment:    Note:  Values greater than or equal to 75.0 nmol/L may        indicate an independent risk factor for CHD,        but must be evaluated with caution when applied        to non-Caucasian populations due to the        influence of genetic factors on Lp(a) across        ethnicities.     Lab Results  Component Value Date   ALT 24 04/03/2023   AST 18 04/03/2023   ALKPHOS 66 04/03/2023   BILITOT 0.4 04/03/2023   Lab Results  Component Value Date   CREATININE 0.78 04/03/2023   BUN 15 04/03/2023   NA 140 04/03/2023   K 4.5 04/03/2023   CL 105 04/03/2023   CO2 20 04/03/2023   No results found for: HGBA1C  Home Medications    Current Outpatient Medications  Medication Sig Dispense Refill   rosuvastatin  (CRESTOR ) 5 MG tablet Take 1 tablet (5 mg total) by mouth 2 (two) times a week. 24 tablet 3   acetaminophen (TYLENOL) 500 MG tablet Take 1 tablet by mouth every 4 (four) hours as needed for mild pain or fever.     albuterol  (VENTOLIN  HFA) 108 (90 Base) MCG/ACT  inhaler Inhale 2 puffs into the lungs every 4 (four) hours as needed for wheezing or shortness of breath. 6.7 g 0   carvedilol  (COREG ) 12.5 MG tablet Take 1 tablet (12.5 mg total) by mouth 2 (two) times daily with a meal. 180 tablet 3   cetirizine (ZYRTEC) 10 MG tablet Take 10 mg by mouth daily. 0.5 TABLET AT BEDTIME     dexamethasone  (DECADRON ) 1 MG tablet Take 1 tablet between 11pm and 12 midnight prior to labs on the next day 1 tablet 0   diclofenac  Sodium (VOLTAREN ) 1 % GEL Apply 4 g topically 4 times daily for 30 days. 500 g 0   fluticasone  (FLONASE) 50 MCG/ACT nasal spray SPRAY 1 SPRAY INTO EACH NOSTRIL EVERY DAY     Fluticasone -Umeclidin-Vilant (TRELEGY ELLIPTA ) 100-62.5-25 MCG/ACT AEPB Inhale 1 puff into the lungs daily. 60 each 0   hydrochlorothiazide  (MICROZIDE ) 12.5 MG capsule Take 1 capsule (12.5 mg total) by mouth daily. 90 capsule 3    hydrocortisone  (ANUSOL -HC) 25 MG suppository Place 1 suppository (25 mg total) rectally 2 (two) times daily for 10 days. 20 suppository 0   metroNIDAZOLE  (METROCREAM ) 0.75 % cream Apply to the face on to the skin 2 times daily 45 g 2   metroNIDAZOLE  (METROCREAM ) 0.75 % cream Apply to affected area(s) of the face twice daily 45 g 6   misoprostol  (CYTOTEC ) 200 MCG tablet Take this medication for 2 nights before surgery and let dissolve between the cheek and gum. 2 tablet 0   norethindrone  (AYGESTIN ) 5 MG tablet Take 1 tablet (5 mg total) by mouth daily. 90 tablet 3   ondansetron  (ZOFRAN -ODT) 4 MG disintegrating tablet Dissolve 1 tablet (4 mg total) on tongue every 8 (eight) hours as needed for nausea or vomiting. 10 tablet 0   Semaglutide -Weight Management (WEGOVY ) 1.7 MG/0.75ML SOAJ Inject 1.7 mg into the skin every 7 (seven) days. 3 mL 0   Semaglutide -Weight Management (WEGOVY ) 2.4 MG/0.75ML SOAJ Inject 2.4 mg into the skin every 7 (seven) days. 3 mL 1   telmisartan  (MICARDIS ) 80 MG tablet Take 1 tablet (80 mg total) by mouth daily. 90 tablet 3   traMADol  (ULTRAM ) 50 MG tablet Take 1 tablet (50 mg total) by mouth every 8 (eight) hours as needed for severe pain (7-10). 21 tablet 0   tretinoin  (RETIN-A ) 0.025 % cream Apply a pea sized amount to the face at bedtime 20 g 3   tretinoin  (RETIN-A ) 0.025 % cream Apply a pea-sized amount topically to entire face at bedtime. 20 g 3   No current facility-administered medications for this visit.     Assessment & Plan    Hyperlipidemia Assessment: Patient with family history of ASCVD not at LDL goal of < 100 Most recent LDL 156 on 04/03/23 Not able to tolerate ezetimibe  secondary to myalgias Reviewed options for lowering LDL cholesterol, including statins and PCSK9.  Explained that most plans will not cover Repatha  until try/fail 2 different statins.    Plan: Patient agreeable to starting rosuvastatin  5 mg twice weekly Get lipid labs this week to  determine current baseline.  Repeat labs after 8 weeks on statin.   Lipid Liver function    Allean Mink, PharmD CPP [facility] [address]  [place], [place] [address] [phone]  11/19/2023, 4:42 PM

## 2023-11-20 DIAGNOSIS — E7800 Pure hypercholesterolemia, unspecified: Secondary | ICD-10-CM | POA: Diagnosis not present

## 2023-11-21 LAB — HEPATIC FUNCTION PANEL
ALT: 24 [IU]/L (ref 0–32)
AST: 16 [IU]/L (ref 0–40)
Albumin: 4.3 g/dL (ref 3.8–4.9)
Alkaline Phosphatase: 56 [IU]/L (ref 44–121)
Bilirubin Total: 0.4 mg/dL (ref 0.0–1.2)
Bilirubin, Direct: 0.13 mg/dL (ref 0.00–0.40)
Total Protein: 6.4 g/dL (ref 6.0–8.5)

## 2023-11-21 LAB — LIPID PANEL
Chol/HDL Ratio: 5.6 {ratio} — ABNORMAL HIGH (ref 0.0–4.4)
Cholesterol, Total: 219 mg/dL — ABNORMAL HIGH (ref 100–199)
HDL: 39 mg/dL — ABNORMAL LOW (ref >39)
LDL Chol Calc (NIH): 161 mg/dL — ABNORMAL HIGH (ref 0–99)
Triglycerides: 103 mg/dL (ref 0–149)
VLDL Cholesterol Cal: 19 mg/dL (ref 5–40)

## 2023-11-26 DIAGNOSIS — M25561 Pain in right knee: Secondary | ICD-10-CM | POA: Diagnosis not present

## 2023-11-26 DIAGNOSIS — G8929 Other chronic pain: Secondary | ICD-10-CM | POA: Diagnosis not present

## 2023-11-26 DIAGNOSIS — R29898 Other symptoms and signs involving the musculoskeletal system: Secondary | ICD-10-CM | POA: Diagnosis not present

## 2023-11-26 DIAGNOSIS — M25661 Stiffness of right knee, not elsewhere classified: Secondary | ICD-10-CM | POA: Diagnosis not present

## 2023-12-02 ENCOUNTER — Telehealth: Payer: Self-pay

## 2023-12-02 ENCOUNTER — Other Ambulatory Visit (HOSPITAL_BASED_OUTPATIENT_CLINIC_OR_DEPARTMENT_OTHER): Payer: Self-pay

## 2023-12-02 DIAGNOSIS — E7800 Pure hypercholesterolemia, unspecified: Secondary | ICD-10-CM

## 2023-12-02 DIAGNOSIS — E782 Mixed hyperlipidemia: Secondary | ICD-10-CM

## 2023-12-02 MED ORDER — ROSUVASTATIN CALCIUM 10 MG PO TABS
10.0000 mg | ORAL_TABLET | Freq: Every day | ORAL | 3 refills | Status: DC
  Filled 2023-12-02: qty 90, 90d supply, fill #0

## 2023-12-02 NOTE — Telephone Encounter (Signed)
 MyChart 2/19

## 2023-12-02 NOTE — Telephone Encounter (Signed)
-----   Message from Aundra Dubin Revankar sent at 11/30/2023  8:08 PM EST ----- Diet and give her a low-cholesterol diet sheet.  Double statin and liver lipid check in 6 weeks.  Copy primary diet and excise. Garwin Brothers, MD 11/30/2023 8:07 PM

## 2023-12-03 ENCOUNTER — Telehealth: Payer: Commercial Managed Care - PPO | Admitting: Physician Assistant

## 2023-12-03 ENCOUNTER — Other Ambulatory Visit (HOSPITAL_BASED_OUTPATIENT_CLINIC_OR_DEPARTMENT_OTHER): Payer: Self-pay

## 2023-12-03 DIAGNOSIS — R3989 Other symptoms and signs involving the genitourinary system: Secondary | ICD-10-CM

## 2023-12-03 MED ORDER — SULFAMETHOXAZOLE-TRIMETHOPRIM 800-160 MG PO TABS
1.0000 | ORAL_TABLET | Freq: Two times a day (BID) | ORAL | 0 refills | Status: DC
  Filled 2023-12-03: qty 10, 5d supply, fill #0

## 2023-12-03 NOTE — Progress Notes (Signed)
 Virtual Visit Consent   Lindsay Burke, you are scheduled for a virtual visit with a [facility] provider today. Just as with appointments in the office, your consent must be obtained to participate. Your consent will be active for this visit and any virtual visit you may have with one of our providers in the next 365 days. If you have a MyChart account, a copy of this consent can be sent to you electronically.  As this is a virtual visit, video technology does not allow for your provider to perform a traditional examination. This may limit your provider's ability to fully assess your condition. If your provider identifies any concerns that need to be evaluated in person or the need to arrange testing (such as labs, EKG, etc.), we will make arrangements to do so. Although advances in technology are sophisticated, we cannot ensure that it will always work on either your end or our end. If the connection with a video visit is poor, the visit may have to be switched to a telephone visit. With either a video or telephone visit, we are not always able to ensure that we have a secure connection.  By engaging in this virtual visit, you consent to the provision of healthcare and authorize for your insurance to be billed (if applicable) for the services provided during this visit. Depending on your insurance coverage, you may receive a charge related to this service.  I need to obtain your verbal consent now. Are you willing to proceed with your visit today? Lindsay Burke has provided verbal consent on 12/03/2023 for a virtual visit (video or telephone). Lindsay Burke, [place]  Date: 12/03/2023 11:51 AM   Virtual Visit via Video Note   I, Lindsay Burke, connected with  Lindsay Burke  ([id number], May 16, 1971) on 12/03/23 at 11:45 AM EST by a video-enabled telemedicine application and verified that I am speaking with the correct person using two identifiers.  Location: Patient: Virtual Visit Location  Patient: Home Provider: Virtual Visit Location Provider: Home Office   I discussed the limitations of evaluation and management by telemedicine and the availability of in person appointments. The patient expressed understanding and agreed to proceed.    History of Present Illness: Lindsay Burke is a 53 y.o. who identifies as a female who was assigned female at birth, and is being seen today for possible UTI. Endorses symptoms starting in past day with urinary urgency, frequency and hesitancy. Also noting some dysuria. Denies hematuria, fever, vomiting, back/belly pain. Notes suprapubic pressure. Is trying to hydrate better.   HPI: HPI  Problems:  Patient Active Problem List   Diagnosis Date Noted   Mixed dyslipidemia 04/03/2023   Moderate mitral regurgitation 04/03/2023   Hyperlipidemia 04/03/2023   Family history of coronary artery disease 04/03/2023   Overweight (BMI 25.0-29.9) 04/03/2023   Hypertension 04/02/2023   Asthma 08/22/2021   Palpitations 08/28/2020   Dyspnea due to COVID-19 12/21/2019   COVID-19 10/25/2019   Essential hypertension 09/05/2019   Malaise and fatigue 09/05/2019   Cardiac risk counseling 09/05/2019   Snores 09/05/2019   Mild intermittent asthma with acute exacerbation 07/09/2018   Vitamin D deficiency 07/09/2018   Fibrocystic breast changes, left 11/09/2017   Hashimoto's disease 06/19/2017   Abnormal uterine bleeding 03/12/2016   History of endometrial ablation 03/12/2016   Menorrhagia with irregular cycle 03/12/2016   Thyroid disorder 03/12/2016   Postcoital bleeding 03/12/2016   Abnormality of left breast on screening mammogram 12/03/2015   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  02/21/2015   Protrusion of intervertebral disc of lumbosacral region 02/21/2015   Right-sided low back pain without sciatica 01/30/2015   SI (sacroiliac) joint dysfunction 01/30/2015   History of renal stone 03/08/2014   H/O knee surgery 03/08/2014   Hx of  cholecystectomy 03/08/2014   Fatigue 01/18/2014   Multinodular goiter 01/18/2014    Allergies:  Allergies  Allergen Reactions   Acetaminophen Hypertension, Other (See Comments) and Rash    Tylenol arthritis puts into CHF    Clindamycin/Lincomycin Diarrhea   Hydralazine Shortness Of Breath and Swelling   Phentermine Palpitations and Hypertension   Chlorthalidone Hypertension        Nifedipine Rash and Hypertension   Clonidine Other (See Comments)    NIGHTMARES, SLEEPY   Spironolactone Hypertension   Zetia [Ezetimibe] Other (See Comments)    Muscle pain   Aleve [Naproxen] Palpitations    HR 120   Cephalexin Rash   Medications:  Current Outpatient Medications:    sulfamethoxazole-trimethoprim (BACTRIM DS) 800-160 MG tablet, Take 1 tablet by mouth 2 (two) times daily., Disp: 10 tablet, Rfl: 0   acetaminophen (TYLENOL) 500 MG tablet, Take 1 tablet by mouth every 4 (four) hours as needed for mild pain or fever., Disp: , Rfl:    albuterol (VENTOLIN HFA) 108 (90 Base) MCG/ACT inhaler, Inhale 2 puffs into the lungs every 4 (four) hours as needed for wheezing or shortness of breath., Disp: 6.7 g, Rfl: 0   carvedilol (COREG) 12.5 MG tablet, Take 1 tablet (12.5 mg total) by mouth 2 (two) times daily with a meal., Disp: 180 tablet, Rfl: 3   cetirizine (ZYRTEC) 10 MG tablet, Take 10 mg by mouth daily. 0.5 TABLET AT BEDTIME, Disp: , Rfl:    fluticasone (FLONASE) 50 MCG/ACT nasal spray, SPRAY 1 SPRAY INTO EACH NOSTRIL EVERY DAY, Disp: , Rfl:    Fluticasone-Umeclidin-Vilant (TRELEGY ELLIPTA) 100-62.5-25 MCG/ACT AEPB, Inhale 1 puff into the lungs daily., Disp: 60 each, Rfl: 0   hydrochlorothiazide (MICROZIDE) 12.5 MG capsule, Take 1 capsule (12.5 mg total) by mouth daily., Disp: 90 capsule, Rfl: 3   hydrocortisone (ANUSOL-HC) 25 MG suppository, Place 1 suppository (25 mg total) rectally 2 (two) times daily for 10 days., Disp: 20 suppository, Rfl: 0   metroNIDAZOLE (METROCREAM) 0.75 % cream, Apply  to the face on to the skin 2 times daily, Disp: 45 g, Rfl: 2   metroNIDAZOLE (METROCREAM) 0.75 % cream, Apply to affected area(s) of the face twice daily, Disp: 45 g, Rfl: 6   norethindrone (AYGESTIN) 5 MG tablet, Take 1 tablet (5 mg total) by mouth daily., Disp: 90 tablet, Rfl: 3   ondansetron (ZOFRAN-ODT) 4 MG disintegrating tablet, Dissolve 1 tablet (4 mg total) on tongue every 8 (eight) hours as needed for nausea or vomiting., Disp: 10 tablet, Rfl: 0   rosuvastatin (CRESTOR) 10 MG tablet, Take 1 tablet (10 mg total) by mouth daily., Disp: 90 tablet, Rfl: 3   telmisartan (MICARDIS) 80 MG tablet, Take 1 tablet (80 mg total) by mouth daily., Disp: 90 tablet, Rfl: 3   traMADol (ULTRAM) 50 MG tablet, Take 1 tablet (50 mg total) by mouth every 8 (eight) hours as needed for severe pain (7-10)., Disp: 21 tablet, Rfl: 0   tretinoin (RETIN-A) 0.025 % cream, Apply a pea sized amount to the face at bedtime, Disp: 20 g, Rfl: 3   tretinoin (RETIN-A) 0.025 % cream, Apply a pea-sized amount topically to entire face at bedtime., Disp: 20 g, Rfl: 3  Observations/Objective: Patient is well-developed,  well-nourished in no acute distress.  Resting comfortably at home.  Head is normocephalic, atraumatic.  No labored breathing. Speech is clear and coherent with logical content.  Patient is alert and oriented at baseline.   Assessment and Plan: 1. Suspected UTI (Primary) - sulfamethoxazole-trimethoprim (BACTRIM DS) 800-160 MG tablet; Take 1 tablet by mouth 2 (two) times daily.  Dispense: 10 tablet; Refill: 0  Classic UTI symptoms with absence of alarm signs or symptoms. Prior history of UTI. Will treat empirically with Bactrim for suspected uncomplicated cystitis. Supportive measures and OTC medications reviewed. Strict in-person evaluation precautions discussed.    Follow Up Instructions: I discussed the assessment and treatment plan with the patient. The patient was provided an opportunity to ask questions  and all were answered. The patient agreed with the plan and demonstrated an understanding of the instructions.  A copy of instructions were sent to the patient via MyChart unless otherwise noted below.   The patient was advised to call back or seek an in-person evaluation if the symptoms worsen or if the condition fails to improve as anticipated.    Lindsay Climes, PA-C

## 2023-12-03 NOTE — Patient Instructions (Signed)
 Lindsay Burke, thank you for joining Lindsay Climes, PA-C for today's virtual visit.  While this provider is not your primary care provider (PCP), if your PCP is located in our provider database this encounter information will be shared with them immediately following your visit.   A [facility] MyChart account gives you access to today's visit and all your visits, tests, and labs performed at [facility] " click here if you don't have a [facility] MyChart account or go to [email or web address]  Consent: (Patient) Lindsay Burke provided verbal consent for this virtual visit at the beginning of the encounter.  Current Medications:  Current Outpatient Medications:    sulfamethoxazole-trimethoprim (BACTRIM DS) 800-160 MG tablet, Take 1 tablet by mouth 2 (two) times daily., Disp: 10 tablet, Rfl: 0   acetaminophen (TYLENOL) 500 MG tablet, Take 1 tablet by mouth every 4 (four) hours as needed for mild pain or fever., Disp: , Rfl:    albuterol (VENTOLIN HFA) 108 (90 Base) MCG/ACT inhaler, Inhale 2 puffs into the lungs every 4 (four) hours as needed for wheezing or shortness of breath., Disp: 6.7 g, Rfl: 0   carvedilol (COREG) 12.5 MG tablet, Take 1 tablet (12.5 mg total) by mouth 2 (two) times daily with a meal., Disp: 180 tablet, Rfl: 3   cetirizine (ZYRTEC) 10 MG tablet, Take 10 mg by mouth daily. 0.5 TABLET AT BEDTIME, Disp: , Rfl:    fluticasone (FLONASE) 50 MCG/ACT nasal spray, SPRAY 1 SPRAY INTO EACH NOSTRIL EVERY DAY, Disp: , Rfl:    Fluticasone-Umeclidin-Vilant (TRELEGY ELLIPTA) 100-62.5-25 MCG/ACT AEPB, Inhale 1 puff into the lungs daily., Disp: 60 each, Rfl: 0   hydrochlorothiazide (MICROZIDE) 12.5 MG capsule, Take 1 capsule (12.5 mg total) by mouth daily., Disp: 90 capsule, Rfl: 3   hydrocortisone (ANUSOL-HC) 25 MG suppository, Place 1 suppository (25 mg total) rectally 2 (two) times daily for 10 days., Disp: 20 suppository, Rfl: 0   metroNIDAZOLE (METROCREAM) 0.75 %  cream, Apply to the face on to the skin 2 times daily, Disp: 45 g, Rfl: 2   metroNIDAZOLE (METROCREAM) 0.75 % cream, Apply to affected area(s) of the face twice daily, Disp: 45 g, Rfl: 6   norethindrone (AYGESTIN) 5 MG tablet, Take 1 tablet (5 mg total) by mouth daily., Disp: 90 tablet, Rfl: 3   ondansetron (ZOFRAN-ODT) 4 MG disintegrating tablet, Dissolve 1 tablet (4 mg total) on tongue every 8 (eight) hours as needed for nausea or vomiting., Disp: 10 tablet, Rfl: 0   rosuvastatin (CRESTOR) 10 MG tablet, Take 1 tablet (10 mg total) by mouth daily., Disp: 90 tablet, Rfl: 3   telmisartan (MICARDIS) 80 MG tablet, Take 1 tablet (80 mg total) by mouth daily., Disp: 90 tablet, Rfl: 3   traMADol (ULTRAM) 50 MG tablet, Take 1 tablet (50 mg total) by mouth every 8 (eight) hours as needed for severe pain (7-10)., Disp: 21 tablet, Rfl: 0   tretinoin (RETIN-A) 0.025 % cream, Apply a pea sized amount to the face at bedtime, Disp: 20 g, Rfl: 3   tretinoin (RETIN-A) 0.025 % cream, Apply a pea-sized amount topically to entire face at bedtime., Disp: 20 g, Rfl: 3   Medications ordered in this encounter:  Meds ordered this encounter  Medications   sulfamethoxazole-trimethoprim (BACTRIM DS) 800-160 MG tablet    Sig: Take 1 tablet by mouth 2 (two) times daily.    Dispense:  10 tablet    Refill:  0    Supervising Provider:   Merrilee Jansky [  [id number]]     *If you need refills on other medications prior to your next appointment, please contact your pharmacy*  Follow-Up: Call back or seek an in-person evaluation if the symptoms worsen or if the condition fails to improve as anticipated.  [facility] [phone]  Other Instructions Your symptoms are consistent with a bladder infection, also called acute cystitis. Please take your antibiotic (Bactrim) as directed until all pills are gone.  Stay very well hydrated.  Consider a daily probiotic (Align, Culturelle, or Activia) to help prevent  stomach upset caused by the antibiotic.  Taking a probiotic daily may also help prevent recurrent UTIs.  Also consider taking AZO (Phenazopyridine) tablets to help decrease pain with urination.   Urinary Tract Infection A urinary tract infection (UTI) can occur any place along the urinary tract. The tract includes the kidneys, ureters, bladder, and urethra. A type of germ called bacteria often causes a UTI. UTIs are often helped with antibiotic medicine.  HOME CARE  If given, take antibiotics as told by your doctor. Finish them even if you start to feel better. Drink enough fluids to keep your pee (urine) clear or pale yellow. Avoid tea, drinks with caffeine, and bubbly (carbonated) drinks. Pee often. Avoid holding your pee in for a long time. Pee before and after having sex (intercourse). Wipe from front to back after you poop (bowel movement) if you are a woman. Use each tissue only once. GET HELP RIGHT AWAY IF:  You have back pain. You have lower belly (abdominal) pain. You have chills. You feel sick to your stomach (nauseous). You throw up (vomit). Your burning or discomfort with peeing does not go away. You have a fever. Your symptoms are not better in 3 days. MAKE SURE YOU:  Understand these instructions. Will watch your condition. Will get help right away if you are not doing well or get worse. Document Released: 03/17/2008 Document Revised: 06/23/2012 Document Reviewed: 04/29/2012 [facility] Patient Information 2015 [place], [place]. This information is not intended to replace advice given to you by your health care provider. Make sure you discuss any questions you have with your health care provider.    If you have been instructed to have an in-person evaluation today at a local Urgent Care facility, please use the link below. It will take you to a list of all of our available [facility], including address, phone number and hours of operation. Please do not delay  care.  [facility]  If you or a family member do not have a primary care provider, use the link below to schedule a visit and establish care. When you choose a [facility] primary care physician or advanced practice provider, you gain a long-term partner in health. Find a Primary Care Provider  Learn more about [facility]'s in-office and virtual care options: [facility] - Get Care Now

## 2023-12-10 NOTE — Addendum Note (Signed)
 Addended by: Waldon Merl on: 12/10/2023 06:55 AM   Modules accepted: Level of Service

## 2023-12-24 DIAGNOSIS — G8929 Other chronic pain: Secondary | ICD-10-CM | POA: Diagnosis not present

## 2023-12-24 DIAGNOSIS — M25561 Pain in right knee: Secondary | ICD-10-CM | POA: Diagnosis not present

## 2023-12-24 DIAGNOSIS — S83241A Other tear of medial meniscus, current injury, right knee, initial encounter: Secondary | ICD-10-CM | POA: Diagnosis not present

## 2024-01-06 ENCOUNTER — Other Ambulatory Visit (HOSPITAL_BASED_OUTPATIENT_CLINIC_OR_DEPARTMENT_OTHER): Payer: Self-pay

## 2024-01-06 MED ORDER — NORETHINDRONE ACETATE 5 MG PO TABS
5.0000 mg | ORAL_TABLET | Freq: Every day | ORAL | 0 refills | Status: DC
  Filled 2024-01-06: qty 90, 90d supply, fill #0

## 2024-01-08 DIAGNOSIS — E7800 Pure hypercholesterolemia, unspecified: Secondary | ICD-10-CM | POA: Diagnosis not present

## 2024-01-08 DIAGNOSIS — E559 Vitamin D deficiency, unspecified: Secondary | ICD-10-CM | POA: Diagnosis not present

## 2024-01-08 DIAGNOSIS — H6123 Impacted cerumen, bilateral: Secondary | ICD-10-CM | POA: Diagnosis not present

## 2024-01-08 DIAGNOSIS — E538 Deficiency of other specified B group vitamins: Secondary | ICD-10-CM | POA: Diagnosis not present

## 2024-01-08 DIAGNOSIS — R5383 Other fatigue: Secondary | ICD-10-CM | POA: Diagnosis not present

## 2024-01-08 DIAGNOSIS — E782 Mixed hyperlipidemia: Secondary | ICD-10-CM | POA: Diagnosis not present

## 2024-01-09 LAB — LIPID PANEL
Chol/HDL Ratio: 3.4 ratio (ref 0.0–4.4)
Cholesterol, Total: 125 mg/dL (ref 100–199)
HDL: 37 mg/dL — ABNORMAL LOW (ref >39)
LDL Chol Calc (NIH): 71 mg/dL (ref 0–99)
Triglycerides: 88 mg/dL (ref 0–149)
VLDL Cholesterol Cal: 17 mg/dL (ref 5–40)

## 2024-01-09 LAB — HEPATIC FUNCTION PANEL
ALT: 36 IU/L — ABNORMAL HIGH (ref 0–32)
AST: 23 IU/L (ref 0–40)
Albumin: 4.5 g/dL (ref 3.8–4.9)
Alkaline Phosphatase: 72 IU/L (ref 44–121)
Bilirubin Total: 0.5 mg/dL (ref 0.0–1.2)
Bilirubin, Direct: 0.18 mg/dL (ref 0.00–0.40)
Total Protein: 6.4 g/dL (ref 6.0–8.5)

## 2024-01-12 ENCOUNTER — Telehealth: Payer: Self-pay

## 2024-01-12 DIAGNOSIS — R7989 Other specified abnormal findings of blood chemistry: Secondary | ICD-10-CM

## 2024-01-12 NOTE — Telephone Encounter (Signed)
-----   Message from [place] Revankar sent at 01/12/2024  9:37 AM EDT ----- Mildly elevated LFTs.  Recheck LFTs in 1 month.  Lipids are fine.  Diet and exercise.  Copy primary Garwin Brothers, MD 01/12/2024 9:36 AM

## 2024-01-12 NOTE — Telephone Encounter (Signed)
 MyChart message

## 2024-02-10 ENCOUNTER — Encounter: Payer: Self-pay | Admitting: Cardiology

## 2024-02-10 DIAGNOSIS — E782 Mixed hyperlipidemia: Secondary | ICD-10-CM

## 2024-02-10 DIAGNOSIS — E7800 Pure hypercholesterolemia, unspecified: Secondary | ICD-10-CM

## 2024-02-11 NOTE — Telephone Encounter (Signed)
 I placed a referral to the lipid clinic as the pt is intolerant to Zetia  too.

## 2024-03-08 ENCOUNTER — Encounter: Payer: Self-pay | Admitting: Cardiology

## 2024-03-08 DIAGNOSIS — M50121 Cervical disc disorder at C4-C5 level with radiculopathy: Secondary | ICD-10-CM | POA: Diagnosis not present

## 2024-03-08 DIAGNOSIS — R7989 Other specified abnormal findings of blood chemistry: Secondary | ICD-10-CM

## 2024-03-08 DIAGNOSIS — M7918 Myalgia, other site: Secondary | ICD-10-CM | POA: Diagnosis not present

## 2024-03-08 DIAGNOSIS — M50122 Cervical disc disorder at C5-C6 level with radiculopathy: Secondary | ICD-10-CM | POA: Diagnosis not present

## 2024-03-08 DIAGNOSIS — Z1331 Encounter for screening for depression: Secondary | ICD-10-CM | POA: Diagnosis not present

## 2024-03-08 DIAGNOSIS — M4312 Spondylolisthesis, cervical region: Secondary | ICD-10-CM | POA: Diagnosis not present

## 2024-03-08 DIAGNOSIS — M542 Cervicalgia: Secondary | ICD-10-CM | POA: Diagnosis not present

## 2024-03-08 NOTE — Telephone Encounter (Signed)
 Patient is calling to follow up on this message due to mychart advising her Dr. Lafayette Pierre is out. She would like a callback or mychart message letting her know once orders have been signed. Please advise.

## 2024-03-09 DIAGNOSIS — R7989 Other specified abnormal findings of blood chemistry: Secondary | ICD-10-CM | POA: Diagnosis not present

## 2024-03-10 LAB — HEPATIC FUNCTION PANEL
ALT: 25 IU/L (ref 0–32)
AST: 15 IU/L (ref 0–40)
Albumin: 4.6 g/dL (ref 3.8–4.9)
Alkaline Phosphatase: 76 IU/L (ref 44–121)
Bilirubin Total: 0.4 mg/dL (ref 0.0–1.2)
Bilirubin, Direct: 0.13 mg/dL (ref 0.00–0.40)
Total Protein: 6.9 g/dL (ref 6.0–8.5)

## 2024-03-23 DIAGNOSIS — M6281 Muscle weakness (generalized): Secondary | ICD-10-CM | POA: Diagnosis not present

## 2024-03-23 DIAGNOSIS — M5010 Cervical disc disorder with radiculopathy, unspecified cervical region: Secondary | ICD-10-CM | POA: Diagnosis not present

## 2024-03-28 ENCOUNTER — Other Ambulatory Visit (HOSPITAL_BASED_OUTPATIENT_CLINIC_OR_DEPARTMENT_OTHER): Payer: Self-pay

## 2024-03-28 DIAGNOSIS — M5010 Cervical disc disorder with radiculopathy, unspecified cervical region: Secondary | ICD-10-CM | POA: Diagnosis not present

## 2024-03-28 DIAGNOSIS — M6281 Muscle weakness (generalized): Secondary | ICD-10-CM | POA: Diagnosis not present

## 2024-03-29 ENCOUNTER — Other Ambulatory Visit: Payer: Self-pay | Admitting: Cardiology

## 2024-03-30 ENCOUNTER — Other Ambulatory Visit (HOSPITAL_BASED_OUTPATIENT_CLINIC_OR_DEPARTMENT_OTHER): Payer: Self-pay

## 2024-03-30 ENCOUNTER — Encounter: Payer: Self-pay | Admitting: Cardiology

## 2024-03-30 DIAGNOSIS — M5010 Cervical disc disorder with radiculopathy, unspecified cervical region: Secondary | ICD-10-CM | POA: Diagnosis not present

## 2024-03-30 DIAGNOSIS — M6281 Muscle weakness (generalized): Secondary | ICD-10-CM | POA: Diagnosis not present

## 2024-03-30 MED ORDER — CARVEDILOL 12.5 MG PO TABS
12.5000 mg | ORAL_TABLET | Freq: Two times a day (BID) | ORAL | 0 refills | Status: DC
  Filled 2024-03-30: qty 60, 30d supply, fill #0

## 2024-04-01 ENCOUNTER — Other Ambulatory Visit (HOSPITAL_BASED_OUTPATIENT_CLINIC_OR_DEPARTMENT_OTHER): Payer: Self-pay

## 2024-04-01 MED ORDER — METHOCARBAMOL 500 MG PO TABS
500.0000 mg | ORAL_TABLET | Freq: Three times a day (TID) | ORAL | 2 refills | Status: AC | PRN
  Filled 2024-04-01: qty 90, 30d supply, fill #0
  Filled 2024-06-01: qty 90, 30d supply, fill #1
  Filled 2024-07-28: qty 90, 30d supply, fill #2

## 2024-04-11 ENCOUNTER — Other Ambulatory Visit (HOSPITAL_BASED_OUTPATIENT_CLINIC_OR_DEPARTMENT_OTHER): Payer: Self-pay

## 2024-04-12 ENCOUNTER — Other Ambulatory Visit: Payer: Self-pay

## 2024-04-12 ENCOUNTER — Other Ambulatory Visit (HOSPITAL_BASED_OUTPATIENT_CLINIC_OR_DEPARTMENT_OTHER): Payer: Self-pay

## 2024-04-12 MED ORDER — NORETHINDRONE ACETATE 5 MG PO TABS
5.0000 mg | ORAL_TABLET | Freq: Every day | ORAL | 0 refills | Status: DC
  Filled 2024-04-12: qty 90, 90d supply, fill #0

## 2024-04-13 DIAGNOSIS — M5010 Cervical disc disorder with radiculopathy, unspecified cervical region: Secondary | ICD-10-CM | POA: Diagnosis not present

## 2024-04-13 DIAGNOSIS — M6281 Muscle weakness (generalized): Secondary | ICD-10-CM | POA: Diagnosis not present

## 2024-04-14 ENCOUNTER — Other Ambulatory Visit (HOSPITAL_BASED_OUTPATIENT_CLINIC_OR_DEPARTMENT_OTHER): Payer: Self-pay

## 2024-04-18 ENCOUNTER — Other Ambulatory Visit (HOSPITAL_BASED_OUTPATIENT_CLINIC_OR_DEPARTMENT_OTHER): Payer: Self-pay

## 2024-04-18 MED ORDER — MISOPROSTOL 200 MCG PO TABS
200.0000 ug | ORAL_TABLET | ORAL | 0 refills | Status: DC
  Filled 2024-04-18: qty 2, 2d supply, fill #0

## 2024-04-22 DIAGNOSIS — M5010 Cervical disc disorder with radiculopathy, unspecified cervical region: Secondary | ICD-10-CM | POA: Diagnosis not present

## 2024-04-22 DIAGNOSIS — M6281 Muscle weakness (generalized): Secondary | ICD-10-CM | POA: Diagnosis not present

## 2024-04-24 DIAGNOSIS — M4802 Spinal stenosis, cervical region: Secondary | ICD-10-CM | POA: Diagnosis not present

## 2024-04-24 DIAGNOSIS — M5011 Cervical disc disorder with radiculopathy,  high cervical region: Secondary | ICD-10-CM | POA: Diagnosis not present

## 2024-04-24 DIAGNOSIS — M50123 Cervical disc disorder at C6-C7 level with radiculopathy: Secondary | ICD-10-CM | POA: Diagnosis not present

## 2024-04-24 DIAGNOSIS — M50121 Cervical disc disorder at C4-C5 level with radiculopathy: Secondary | ICD-10-CM | POA: Diagnosis not present

## 2024-04-24 DIAGNOSIS — M50122 Cervical disc disorder at C5-C6 level with radiculopathy: Secondary | ICD-10-CM | POA: Diagnosis not present

## 2024-04-27 ENCOUNTER — Other Ambulatory Visit: Payer: Self-pay | Admitting: Cardiology

## 2024-04-29 ENCOUNTER — Other Ambulatory Visit (HOSPITAL_BASED_OUTPATIENT_CLINIC_OR_DEPARTMENT_OTHER): Payer: Self-pay

## 2024-04-29 DIAGNOSIS — M6281 Muscle weakness (generalized): Secondary | ICD-10-CM | POA: Diagnosis not present

## 2024-04-29 DIAGNOSIS — M5010 Cervical disc disorder with radiculopathy, unspecified cervical region: Secondary | ICD-10-CM | POA: Diagnosis not present

## 2024-04-29 MED ORDER — CARVEDILOL 12.5 MG PO TABS
12.5000 mg | ORAL_TABLET | Freq: Two times a day (BID) | ORAL | 0 refills | Status: DC
  Filled 2024-04-29: qty 60, 30d supply, fill #0

## 2024-05-02 ENCOUNTER — Other Ambulatory Visit: Payer: Self-pay | Admitting: Cardiology

## 2024-05-04 ENCOUNTER — Other Ambulatory Visit (HOSPITAL_BASED_OUTPATIENT_CLINIC_OR_DEPARTMENT_OTHER): Payer: Self-pay

## 2024-05-04 MED ORDER — HYDROCHLOROTHIAZIDE 12.5 MG PO CAPS
12.5000 mg | ORAL_CAPSULE | Freq: Every day | ORAL | 0 refills | Status: DC
  Filled 2024-05-04: qty 90, 90d supply, fill #0

## 2024-05-06 DIAGNOSIS — M6281 Muscle weakness (generalized): Secondary | ICD-10-CM | POA: Diagnosis not present

## 2024-05-06 DIAGNOSIS — M5010 Cervical disc disorder with radiculopathy, unspecified cervical region: Secondary | ICD-10-CM | POA: Diagnosis not present

## 2024-05-11 DIAGNOSIS — M5010 Cervical disc disorder with radiculopathy, unspecified cervical region: Secondary | ICD-10-CM | POA: Diagnosis not present

## 2024-05-11 DIAGNOSIS — M6281 Muscle weakness (generalized): Secondary | ICD-10-CM | POA: Diagnosis not present

## 2024-05-16 ENCOUNTER — Other Ambulatory Visit: Payer: Self-pay | Admitting: Cardiology

## 2024-05-19 ENCOUNTER — Other Ambulatory Visit (HOSPITAL_BASED_OUTPATIENT_CLINIC_OR_DEPARTMENT_OTHER): Payer: Self-pay

## 2024-05-20 DIAGNOSIS — M5010 Cervical disc disorder with radiculopathy, unspecified cervical region: Secondary | ICD-10-CM | POA: Diagnosis not present

## 2024-05-20 DIAGNOSIS — M6281 Muscle weakness (generalized): Secondary | ICD-10-CM | POA: Diagnosis not present

## 2024-05-24 DIAGNOSIS — M542 Cervicalgia: Secondary | ICD-10-CM | POA: Diagnosis not present

## 2024-05-24 DIAGNOSIS — Z9889 Other specified postprocedural states: Secondary | ICD-10-CM | POA: Diagnosis not present

## 2024-05-24 DIAGNOSIS — N930 Postcoital and contact bleeding: Secondary | ICD-10-CM | POA: Diagnosis not present

## 2024-05-24 DIAGNOSIS — D219 Benign neoplasm of connective and other soft tissue, unspecified: Secondary | ICD-10-CM | POA: Diagnosis not present

## 2024-05-24 DIAGNOSIS — G8929 Other chronic pain: Secondary | ICD-10-CM | POA: Diagnosis not present

## 2024-05-24 DIAGNOSIS — D250 Submucous leiomyoma of uterus: Secondary | ICD-10-CM | POA: Diagnosis not present

## 2024-05-24 DIAGNOSIS — M549 Dorsalgia, unspecified: Secondary | ICD-10-CM | POA: Diagnosis not present

## 2024-05-24 DIAGNOSIS — D251 Intramural leiomyoma of uterus: Secondary | ICD-10-CM | POA: Diagnosis not present

## 2024-05-24 DIAGNOSIS — N939 Abnormal uterine and vaginal bleeding, unspecified: Secondary | ICD-10-CM | POA: Diagnosis not present

## 2024-05-24 DIAGNOSIS — Z01411 Encounter for gynecological examination (general) (routine) with abnormal findings: Secondary | ICD-10-CM | POA: Diagnosis not present

## 2024-05-24 DIAGNOSIS — N858 Other specified noninflammatory disorders of uterus: Secondary | ICD-10-CM | POA: Diagnosis not present

## 2024-05-24 DIAGNOSIS — Z3202 Encounter for pregnancy test, result negative: Secondary | ICD-10-CM | POA: Diagnosis not present

## 2024-05-27 ENCOUNTER — Other Ambulatory Visit (HOSPITAL_BASED_OUTPATIENT_CLINIC_OR_DEPARTMENT_OTHER): Payer: Self-pay

## 2024-05-27 ENCOUNTER — Other Ambulatory Visit: Payer: Self-pay | Admitting: Cardiology

## 2024-05-27 MED ORDER — CARVEDILOL 12.5 MG PO TABS
12.5000 mg | ORAL_TABLET | Freq: Two times a day (BID) | ORAL | 0 refills | Status: DC
  Filled 2024-05-27: qty 60, 30d supply, fill #0

## 2024-05-30 ENCOUNTER — Ambulatory Visit (HOSPITAL_BASED_OUTPATIENT_CLINIC_OR_DEPARTMENT_OTHER): Admitting: Internal Medicine

## 2024-05-30 ENCOUNTER — Encounter (HOSPITAL_BASED_OUTPATIENT_CLINIC_OR_DEPARTMENT_OTHER): Payer: Self-pay | Admitting: Internal Medicine

## 2024-05-30 ENCOUNTER — Other Ambulatory Visit (HOSPITAL_BASED_OUTPATIENT_CLINIC_OR_DEPARTMENT_OTHER): Payer: Self-pay

## 2024-05-30 VITALS — BP 122/70 | HR 70 | Ht 64.0 in | Wt 160.0 lb

## 2024-05-30 DIAGNOSIS — E7800 Pure hypercholesterolemia, unspecified: Secondary | ICD-10-CM

## 2024-05-30 DIAGNOSIS — R7989 Other specified abnormal findings of blood chemistry: Secondary | ICD-10-CM | POA: Diagnosis not present

## 2024-05-30 DIAGNOSIS — Z8249 Family history of ischemic heart disease and other diseases of the circulatory system: Secondary | ICD-10-CM

## 2024-05-30 DIAGNOSIS — E785 Hyperlipidemia, unspecified: Secondary | ICD-10-CM | POA: Diagnosis not present

## 2024-05-30 DIAGNOSIS — T466X5A Adverse effect of antihyperlipidemic and antiarteriosclerotic drugs, initial encounter: Secondary | ICD-10-CM

## 2024-05-30 DIAGNOSIS — Z79899 Other long term (current) drug therapy: Secondary | ICD-10-CM

## 2024-05-30 DIAGNOSIS — T466X5D Adverse effect of antihyperlipidemic and antiarteriosclerotic drugs, subsequent encounter: Secondary | ICD-10-CM

## 2024-05-30 DIAGNOSIS — E782 Mixed hyperlipidemia: Secondary | ICD-10-CM

## 2024-05-30 MED ORDER — ROSUVASTATIN CALCIUM 5 MG PO TABS
5.0000 mg | ORAL_TABLET | Freq: Every day | ORAL | 2 refills | Status: AC
Start: 2024-05-30 — End: ?
  Filled 2024-05-30 – 2024-06-03 (×2): qty 90, 90d supply, fill #0
  Filled 2024-09-29: qty 90, 90d supply, fill #1

## 2024-05-30 NOTE — Patient Instructions (Signed)
 Medication Instructions:   START TAKING ROSUVASTATIN  (CRESTOR ) 5 MG BY MOUTH DAILY  *If you need a refill on your cardiac medications before your next appointment, please call your pharmacy*   Lab Work:  IN 3 MONTHS (AROUND 08/30/2024)AT ANY [facility] NEAR YOU--NMR LIPOPROFILE AND LIPOPROTEIN A--PLEASE COME FASTING TO THIS LAB APPOINTMENT  If you have labs (blood work) drawn today and your tests are completely normal, you will receive your results only by: MyChart Message (if you have MyChart) OR A paper copy in the mail If you have any lab test that is abnormal or we need to change your treatment, we will call you to review the results.    Follow-Up:  AS NEEDED WITH DR. HILTY   Other Instructions  [facility] Contact for Alternative locations and appointment scheduling   [email or web address]   [email or web address]  [phone]

## 2024-05-30 NOTE — Progress Notes (Unsigned)
 LIPID CLINIC CONSULT NOTE  Chief Complaint:  Manage dyslipidemia  Primary Care Physician: Mayo, Dedra Maus, PA-C  Primary Cardiologist:  None  HPI:  Lindsay Burke is a 53 y.o. female who is being seen today for the evaluation of dyslipidemia at the request of Revankar, Jennifer SAUNDERS, MD. this is a pleasant 53 year old female kindly referred for evaluation management of dyslipidemia.  She is followed by Dr. Revankar for cardiology.  She has had ongoing high cholesterol.  Labs in February 2025 showed total cholesterol 219 with triglycerides 103, HDL 39 and LDL 161.  She was advised at that point to try rosuvastatin  and she was able to tolerate that for about 2 months but then developed severe body aches and muscle and joint pain.  She did back off of the medication somewhat but the symptoms continued until she stopped the medicine.  She has had a number of intolerances to other medications as well.  This also included ezetimibe .  There is a history of heart attack in her father.  She had a calcium  score in 2021 that was 0.  PMHx:  Past Medical History:  Diagnosis Date   Abnormal uterine bleeding 03/12/2016   Abnormality of left breast on screening mammogram 12/03/2015   Asthma    Cardiac risk counseling 09/05/2019   COVID-19 10/25/2019   Dyspnea due to COVID-19 12/21/2019   Essential hypertension 09/05/2019   Fatigue 01/18/2014   Fibrocystic breast changes, left 11/09/2017   H/O knee surgery 03/08/2014   1993   Hashimoto's disease 06/19/2017   History of endometrial ablation 03/12/2016   History of renal stone 03/08/2014   S/p cystoscopy R ureter  S/p cystoscopy R ureter  S/p cystoscopy R ureter   Hx of cholecystectomy 03/08/2014   2008  2008   Hypertension    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   Malaise and fatigue 09/05/2019   Menorrhagia with irregular cycle 03/12/2016   Mild intermittent asthma with acute exacerbation 07/09/2018   Multinodular goiter  01/18/2014   Palpitations 08/28/2020   Postcoital bleeding 03/12/2016   Protrusion of intervertebral disc of lumbosacral region 02/21/2015   Right-sided low back pain without sciatica 01/30/2015   SI (sacroiliac) joint dysfunction 01/30/2015   Snores 09/05/2019   Thyroid  disorder 03/12/2016   Vitamin D deficiency 07/09/2018    Past Surgical History:  Procedure Laterality Date   CHOLECYSTECTOMY      FAMHx:  Family History  Problem Relation Age of Onset   Hypertension Mother    Heart attack Father    Hypertension Father     SOCHx:   reports that she has never smoked. She has never been exposed to tobacco smoke. She has never used smokeless tobacco. She reports that she does not drink alcohol and does not use drugs.  ALLERGIES:  Allergies  Allergen Reactions   Acetaminophen Hypertension, Other (See Comments) and Rash    Tylenol arthritis puts into CHF    Clindamycin/Lincomycin Diarrhea   Hydralazine  Shortness Of Breath and Swelling   Phentermine Palpitations and Hypertension   Chlorthalidone Hypertension        Nifedipine Rash and Hypertension   Tramadol  Other (See Comments)   Clonidine Other (See Comments)    NIGHTMARES, SLEEPY   Rosuvastatin  Other (See Comments)    Muscle pain   Spironolactone  Hypertension   Zetia  [Ezetimibe ] Other (See Comments)    Muscle pain   Aleve [Naproxen] Palpitations    HR 120   Cephalexin  Rash    ROS: Pertinent  items noted in HPI and remainder of comprehensive ROS otherwise negative.  HOME MEDS: Current Outpatient Medications on File Prior to Visit  Medication Sig Dispense Refill   albuterol  (VENTOLIN  HFA) 108 (90 Base) MCG/ACT inhaler Inhale 2 puffs into the lungs every 4 (four) hours as needed for wheezing or shortness of breath. 6.7 g 0   carvedilol  (COREG ) 12.5 MG tablet Take 1 tablet (12.5 mg total) by mouth 2 (two) times daily with a meal. Patient must attend upcoming appointment for further refills. 60 tablet 0   cetirizine  (ZYRTEC) 10 MG tablet Take 10 mg by mouth daily. 0.5 TABLET AT BEDTIME     fluticasone  (FLONASE) 50 MCG/ACT nasal spray SPRAY 1 SPRAY INTO EACH NOSTRIL EVERY DAY     Fluticasone -Umeclidin-Vilant (TRELEGY ELLIPTA ) 100-62.5-25 MCG/ACT AEPB Inhale 1 puff into the lungs daily. 60 each 0   hydrochlorothiazide  (MICROZIDE ) 12.5 MG capsule Take 1 capsule (12.5 mg total) by mouth daily. 90 capsule 0   hydrocortisone  (ANUSOL -HC) 25 MG suppository Place 1 suppository (25 mg total) rectally 2 (two) times daily for 10 days. 20 suppository 0   methocarbamol  (ROBAXIN ) 500 MG tablet Take 1 tablet (500 mg total) by mouth 3 (three) times daily as needed for muscle spasms. 90 tablet 2   metroNIDAZOLE  (METROCREAM ) 0.75 % cream Apply to affected area(s) of the face twice daily 45 g 6   misoprostol  (CYTOTEC ) 200 MCG tablet Take 1 tablet by mouth at 9 pm the night before ultrasound and take 1 tablet in the morning of ultrasound to soften cervix. 2 tablet 0   norethindrone  (AYGESTIN ) 5 MG tablet Take 1 tablet (5 mg total) by mouth daily. *Must schedule an annual exam before future refills can be given.* 90 tablet 0   telmisartan  (MICARDIS ) 80 MG tablet Take 1 tablet (80 mg total) by mouth daily. 90 tablet 3   tretinoin  (RETIN-A ) 0.025 % cream Apply a pea-sized amount topically to entire face at bedtime. 20 g 3   acetaminophen (TYLENOL) 500 MG tablet Take 1 tablet by mouth every 4 (four) hours as needed for mild pain or fever.     metroNIDAZOLE  (METROCREAM ) 0.75 % cream Apply to the face on to the skin 2 times daily 45 g 2   ondansetron  (ZOFRAN -ODT) 4 MG disintegrating tablet Dissolve 1 tablet (4 mg total) on tongue every 8 (eight) hours as needed for nausea or vomiting. 10 tablet 0   sulfamethoxazole -trimethoprim  (BACTRIM  DS) 800-160 MG tablet Take 1 tablet by mouth 2 (two) times daily. 10 tablet 0   traMADol  (ULTRAM ) 50 MG tablet Take 1 tablet (50 mg total) by mouth every 8 (eight) hours as needed for severe pain (7-10). 21  tablet 0   tretinoin  (RETIN-A ) 0.025 % cream Apply a pea sized amount to the face at bedtime 20 g 3   [DISCONTINUED] spironolactone  (ALDACTONE ) 25 MG tablet Take 1 tablet (25 mg total) by mouth daily. 30 tablet 2   No current facility-administered medications on file prior to visit.    LABS/IMAGING: No results found for this or any previous visit (from the past 48 hours). No results found.  LIPID PANEL:    Component Value Date/Time   CHOL 125 01/08/2024 0834   TRIG 88 01/08/2024 0834   HDL 37 (L) 01/08/2024 0834   CHOLHDL 3.4 01/08/2024 0834   LDLCALC 71 01/08/2024 0834    Lipoprotein (a)  Date/Time Value Ref Range Status  09/13/2019 10:30 AM 18.5 <75.0 nmol/L Final    Comment:  Note:  Values greater than or equal to 75.0 nmol/L may        indicate an independent risk factor for CHD,        but must be evaluated with caution when applied        to non-Caucasian populations due to the        influence of genetic factors on Lp(a) across        ethnicities.      WEIGHTS: Wt Readings from Last 3 Encounters:  05/30/24 160 lb (72.6 kg)  04/03/23 156 lb (70.8 kg)  02/10/22 151 lb (68.5 kg)    VITALS: BP 122/70   Pulse 70   Ht 5' 4 (1.626 m)   Wt 160 lb (72.6 kg)   SpO2 98%   BMI 27.46 kg/m   EXAM: Deferred  EKG: Deferred  ASSESSMENT: Dyslipidemia, goal LDL less than 100 Family history of heart disease in her father Ezetimibe  intolerance  PLAN: 1.   Ms. Gonder had intolerance to ezetimibe  and rosuvastatin  in the past but did have an excellent response to the medication, which drove her LDL down to 71.  I am not sure that she needs such substantial lipid lowering.  She might be able to tolerate a lower dose of the rosuvastatin .  She did try to titrate the medication down but never actually had a holiday from the medicine before trying a lower dose.  It is imperative to take this break to allow recovery before consider lowering doses.  She is now willing to try  low-dose rosuvastatin  5 mg daily.  She may be able to tolerate that lower dose and if she gets a pretty good response I suspect we might be able to keep her LDL below 100.  Will plan repeat lipid testing including an NMR and LP(a) in about 3 months.  Thanks again for the kind referral.  Lindsay KYM Maxcy, MD, [facility], FNLA, FACP  [place]  [facility]  Medical Director of the Advanced Lipid Disorders &  Cardiovascular Risk Reduction Clinic Diplomate of the American Board of Clinical Lipidology Attending Cardiologist  Direct Dial: [phone]  Fax: [phone]  Website:  [email or web address] Lindsay Burke 05/30/2024, 3:10 PM

## 2024-06-03 ENCOUNTER — Other Ambulatory Visit (HOSPITAL_BASED_OUTPATIENT_CLINIC_OR_DEPARTMENT_OTHER): Payer: Self-pay

## 2024-06-15 ENCOUNTER — Other Ambulatory Visit: Payer: Self-pay

## 2024-06-16 ENCOUNTER — Other Ambulatory Visit: Payer: Self-pay

## 2024-06-16 ENCOUNTER — Ambulatory Visit: Attending: Cardiology | Admitting: Cardiology

## 2024-06-16 ENCOUNTER — Encounter: Payer: Self-pay | Admitting: Cardiology

## 2024-06-16 VITALS — BP 124/72 | HR 63 | Ht 64.0 in | Wt 157.1 lb

## 2024-06-16 DIAGNOSIS — I10 Essential (primary) hypertension: Secondary | ICD-10-CM | POA: Diagnosis not present

## 2024-06-16 DIAGNOSIS — I340 Nonrheumatic mitral (valve) insufficiency: Secondary | ICD-10-CM

## 2024-06-16 DIAGNOSIS — E782 Mixed hyperlipidemia: Secondary | ICD-10-CM

## 2024-06-16 NOTE — Progress Notes (Signed)
 Cardiology Office Note:    Date:  06/16/2024   ID:  Lindsay Burke, DOB 06/22/1971, MRN [id number]  PCP:  Wilfrid Dedra Maus, PA-C  Cardiologist:  Jennifer JONELLE Crape, MD   Referring MD: Wilfrid Dedra Maus,*    ASSESSMENT:    1. Mixed dyslipidemia   2. Essential hypertension   3. Moderate mitral regurgitation   4. Mixed hyperlipidemia    PLAN:    In order of problems listed above:  Primary prevention stressed with the patient.  Importance of compliance with diet medication stressed and patient verbalized standing. Advised to walk at least half an hour a day on a daily basis if her orthopedic issues permit her. Essential hyper tension: Blood pressure is stable and diet was emphasized.  Lifestyle modification urged. Mixed dyslipidemia: On lipid-lowering medications followed by primary care Moderate mitral regurgitation: Echocardiogram will be done to assess this on follow-up.  I discussed this with the patient at length Russians were answered to her satisfaction. Patient will be seen in follow-up appointment in 6 months or earlier if the patient has any concerns.    Medication Adjustments/Labs and Tests Ordered: Current medicines are reviewed at length with the patient today.  Concerns regarding medicines are outlined above.  Orders Placed This Encounter  Procedures   EKG 12-Lead   No orders of the defined types were placed in this encounter.    No chief complaint on file.    History of Present Illness:    Lindsay Burke is a 53 y.o. female.  Patient has past medical history of mixed dyslipidemia, moderate mitral regurgitation and essential hypertension.  She denies any problems at this time and takes care of activities of daily living.  No chest pain orthopnea or PND.  At the time of my evaluation, the patient is alert awake oriented and in no distress.  She does not exercise on a regular basis because of orthopedic issues involving her back and her vertebrae.  Past  Medical History:  Diagnosis Date   Abnormal uterine bleeding 03/12/2016   Abnormality of left breast on screening mammogram 12/03/2015   Asthma    Cardiac risk counseling 09/05/2019   COVID-19 10/25/2019   Dyspnea due to COVID-19 12/21/2019   Essential hypertension 09/05/2019   Family history of coronary artery disease 04/03/2023   Fatigue 01/18/2014   Fibrocystic breast changes, left 11/09/2017   H/O knee surgery 03/08/2014   1993   Hashimoto's disease 06/19/2017   History of endometrial ablation 03/12/2016   History of renal stone 03/08/2014   S/p cystoscopy R ureter  S/p cystoscopy R ureter  S/p cystoscopy R ureter   Hx of cholecystectomy 03/08/2014   2008  2008   Hyperlipidemia 04/03/2023   Hypertension    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   Malaise and fatigue 09/05/2019   Menorrhagia with irregular cycle 03/12/2016   Mild intermittent asthma with acute exacerbation 07/09/2018   Mixed dyslipidemia 04/03/2023   Moderate mitral regurgitation 04/03/2023   Multinodular goiter 01/18/2014   Overweight (BMI 25.0-29.9) 04/03/2023   Palpitations 08/28/2020   Postcoital bleeding 03/12/2016   Protrusion of intervertebral disc of lumbosacral region 02/21/2015   Right-sided low back pain without sciatica 01/30/2015   SI (sacroiliac) joint dysfunction 01/30/2015   Snores 09/05/2019   Thyroid  disorder 03/12/2016   Vitamin D deficiency 07/09/2018    Past Surgical History:  Procedure Laterality Date   CHOLECYSTECTOMY      Current Medications: Current Meds  Medication Sig   albuterol  (VENTOLIN  HFA)  108 (90 Base) MCG/ACT inhaler Inhale 2 puffs into the lungs every 4 (four) hours as needed for wheezing or shortness of breath.   carvedilol  (COREG ) 12.5 MG tablet Take 1 tablet (12.5 mg total) by mouth 2 (two) times daily with a meal. Patient must attend upcoming appointment for further refills.   cetirizine (ZYRTEC) 10 MG tablet Take 10 mg by mouth daily. 0.5  TABLET AT BEDTIME   fluticasone  (FLONASE) 50 MCG/ACT nasal spray SPRAY 1 SPRAY INTO EACH NOSTRIL EVERY DAY   Fluticasone -Umeclidin-Vilant (TRELEGY ELLIPTA ) 100-62.5-25 MCG/ACT AEPB Inhale 1 puff into the lungs daily.   hydrochlorothiazide  (MICROZIDE ) 12.5 MG capsule Take 1 capsule (12.5 mg total) by mouth daily.   hydrocortisone  (ANUSOL -HC) 25 MG suppository Place 1 suppository (25 mg total) rectally 2 (two) times daily for 10 days.   methocarbamol  (ROBAXIN ) 500 MG tablet Take 1 tablet (500 mg total) by mouth 3 (three) times daily as needed for muscle spasms.   metroNIDAZOLE  (METROCREAM ) 0.75 % cream Apply to affected area(s) of the face twice daily   norethindrone  (AYGESTIN ) 5 MG tablet Take 1 tablet (5 mg total) by mouth daily. *Must schedule an annual exam before future refills can be given.*   rosuvastatin  (CRESTOR ) 5 MG tablet Take 1 tablet (5 mg total) by mouth daily.   telmisartan  (MICARDIS ) 80 MG tablet Take 1 tablet (80 mg total) by mouth daily.   tretinoin  (RETIN-A ) 0.025 % cream Apply a pea-sized amount topically to entire face at bedtime.     Allergies:   Acetaminophen, Clindamycin/lincomycin, Hydralazine , Phentermine, Chlorthalidone, Nifedipine, Tramadol , Clonidine, Rosuvastatin , Spironolactone , Zetia  [ezetimibe ], Aleve [naproxen], and Cephalexin    Social History   Socioeconomic History   Marital status: Married    Spouse name: Not on file   Number of children: Not on file   Years of education: Not on file   Highest education level: Not on file  Occupational History   Not on file  Tobacco Use   Smoking status: Never    Passive exposure: Never   Smokeless tobacco: Never  Vaping Use   Vaping status: Never Used  Substance and Sexual Activity   Alcohol use: Never   Drug use: Never   Sexual activity: Not on file    Comment: progestrone  Other Topics Concern   Not on file  Social History Narrative   Not on file   Social Drivers of Health   Financial Resource Strain:  Low Risk  (03/08/2024)   [facility]   Overall Financial Resource Strain (CARDIA)    Difficulty of Paying Living Expenses: Not hard at all  Food Insecurity: No Food Insecurity (03/08/2024)   Received from [facility]   Hunger Vital Sign    Within the past 12 months, you worried that your food would run out before you got the money to buy more.: Never true    Within the past 12 months, the food you bought just didn't last and you didn't have money to get more.: Never true  Transportation Needs: No Transportation Needs (03/08/2024)   Received from [facility] - Transportation    Lack of Transportation (Medical): No    Lack of Transportation (Non-Medical): No  Physical Activity: Not on file  Stress: Not on file  Social Connections: Unknown (02/11/2022)   [facility]   Social Network    Social Network: Not on file     Family History: The patient's family history includes Heart attack in her father; Hypertension in  her father and mother.  ROS:   Please see the history of present illness.    All other systems reviewed and are negative.  EKGs/Labs/Other Studies Reviewed:    The following studies were reviewed today:  .SABRA   EKG reveals sinus rhythm nonspecific ST-T changes   Recent Labs: 03/09/2024: ALT 25  Recent Lipid Panel    Component Value Date/Time   CHOL 125 01/08/2024 0834   TRIG 88 01/08/2024 0834   HDL 37 (L) 01/08/2024 0834   CHOLHDL 3.4 01/08/2024 0834   LDLCALC 71 01/08/2024 0834    Physical Exam:    VS:  BP 124/72   Pulse 63   Ht 5' 4 (1.626 m)   Wt 157 lb 1.3 oz (71.3 kg)   SpO2 97%   BMI 26.96 kg/m     Wt Readings from Last 3 Encounters:  06/16/24 157 lb 1.3 oz (71.3 kg)  05/30/24 160 lb (72.6 kg)  04/03/23 156 lb (70.8 kg)     GEN: Patient is in no acute distress HEENT: Normal NECK: No JVD; No carotid bruits LYMPHATICS: No lymphadenopathy CARDIAC: Hear sounds regular, 2/6 systolic murmur at the  apex. RESPIRATORY:  Clear to auscultation without rales, wheezing or rhonchi  ABDOMEN: Soft, non-tender, non-distended MUSCULOSKELETAL:  No edema; No deformity  SKIN: Warm and dry NEUROLOGIC:  Alert and oriented x 3 PSYCHIATRIC:  Normal affect   Signed, Jennifer JONELLE Crape, MD  06/16/2024 4:29 PM    [facility]

## 2024-06-16 NOTE — Patient Instructions (Signed)
 Medication Instructions:  Your physician recommends that you continue on your current medications as directed. Please refer to the Current Medication list given to you today.  *If you need a refill on your cardiac medications before your next appointment, please call your pharmacy*  Lab Work: None If you have labs (blood work) drawn today and your tests are completely normal, you will receive your results only by: MyChart Message (if you have MyChart) OR A paper copy in the mail If you have any lab test that is abnormal or we need to change your treatment, we will call you to review the results.  Testing/Procedures: Your physician has requested that you have an echocardiogram. Echocardiography is a painless test that uses sound waves to create images of your heart. It provides your doctor with information about the size and shape of your heart and how well your heart's chambers and valves are working. This procedure takes approximately one hour. There are no restrictions for this procedure. Please do NOT wear cologne, perfume, aftershave, or lotions (deodorant is allowed). Please arrive 15 minutes prior to your appointment time.  Please note: We ask at that you not bring children with you during ultrasound (echo/ vascular) testing. Due to room size and safety concerns, children are not allowed in the ultrasound rooms during exams. Our front office staff cannot provide observation of children in our lobby area while testing is being conducted. An adult accompanying a patient to their appointment will only be allowed in the ultrasound room at the discretion of the ultrasound technician under special circumstances. We apologize for any inconvenience.   Follow-Up: At [facility], you and your health needs are our priority.  As part of our continuing mission to provide you with exceptional heart care, our providers are all part of one team.  This team includes your primary Cardiologist  (physician) and Advanced Practice Providers or APPs (Physician Assistants and Nurse Practitioners) who all work together to provide you with the care you need, when you need it.  Your next appointment:   1 year(s)  Provider:   Hillis Lu, MD    We recommend signing up for the patient portal called "MyChart".  Sign up information is provided on this After Visit Summary.  MyChart is used to connect with patients for Virtual Visits (Telemedicine).  Patients are able to view lab/test results, encounter notes, upcoming appointments, etc.  Non-urgent messages can be sent to your provider as well.   To learn more about what you can do with MyChart, go to [email or web address].   Other Instructions None

## 2024-06-21 ENCOUNTER — Other Ambulatory Visit: Payer: Self-pay | Admitting: Cardiology

## 2024-06-21 DIAGNOSIS — I10 Essential (primary) hypertension: Secondary | ICD-10-CM

## 2024-06-21 DIAGNOSIS — I340 Nonrheumatic mitral (valve) insufficiency: Secondary | ICD-10-CM

## 2024-06-21 DIAGNOSIS — E782 Mixed hyperlipidemia: Secondary | ICD-10-CM

## 2024-06-24 DIAGNOSIS — Z3202 Encounter for pregnancy test, result negative: Secondary | ICD-10-CM | POA: Diagnosis not present

## 2024-06-24 DIAGNOSIS — D251 Intramural leiomyoma of uterus: Secondary | ICD-10-CM | POA: Diagnosis not present

## 2024-06-24 DIAGNOSIS — D250 Submucous leiomyoma of uterus: Secondary | ICD-10-CM | POA: Diagnosis not present

## 2024-06-24 DIAGNOSIS — M549 Dorsalgia, unspecified: Secondary | ICD-10-CM | POA: Diagnosis not present

## 2024-06-24 DIAGNOSIS — N930 Postcoital and contact bleeding: Secondary | ICD-10-CM | POA: Diagnosis not present

## 2024-06-24 DIAGNOSIS — Z9889 Other specified postprocedural states: Secondary | ICD-10-CM | POA: Diagnosis not present

## 2024-06-24 DIAGNOSIS — N939 Abnormal uterine and vaginal bleeding, unspecified: Secondary | ICD-10-CM | POA: Diagnosis not present

## 2024-06-24 DIAGNOSIS — Z01411 Encounter for gynecological examination (general) (routine) with abnormal findings: Secondary | ICD-10-CM | POA: Diagnosis not present

## 2024-06-24 DIAGNOSIS — G8929 Other chronic pain: Secondary | ICD-10-CM | POA: Diagnosis not present

## 2024-06-24 DIAGNOSIS — M542 Cervicalgia: Secondary | ICD-10-CM | POA: Diagnosis not present

## 2024-06-26 ENCOUNTER — Other Ambulatory Visit: Payer: Self-pay | Admitting: Cardiology

## 2024-06-28 ENCOUNTER — Other Ambulatory Visit (HOSPITAL_BASED_OUTPATIENT_CLINIC_OR_DEPARTMENT_OTHER): Payer: Self-pay

## 2024-06-28 MED ORDER — CARVEDILOL 12.5 MG PO TABS
12.5000 mg | ORAL_TABLET | Freq: Two times a day (BID) | ORAL | 3 refills | Status: AC
  Filled 2024-06-28: qty 180, 90d supply, fill #0
  Filled 2024-09-26: qty 180, 90d supply, fill #1

## 2024-07-11 ENCOUNTER — Other Ambulatory Visit (HOSPITAL_BASED_OUTPATIENT_CLINIC_OR_DEPARTMENT_OTHER): Payer: Self-pay

## 2024-07-13 ENCOUNTER — Other Ambulatory Visit (HOSPITAL_BASED_OUTPATIENT_CLINIC_OR_DEPARTMENT_OTHER): Payer: Self-pay

## 2024-07-13 MED ORDER — NORETHINDRONE ACETATE 5 MG PO TABS
5.0000 mg | ORAL_TABLET | Freq: Every day | ORAL | 3 refills | Status: AC
  Filled 2024-07-13: qty 90, 90d supply, fill #0
  Filled 2024-09-29: qty 90, 90d supply, fill #1

## 2024-07-18 ENCOUNTER — Ambulatory Visit (HOSPITAL_BASED_OUTPATIENT_CLINIC_OR_DEPARTMENT_OTHER)
Admission: RE | Admit: 2024-07-18 | Discharge: 2024-07-18 | Disposition: A | Discharge status: Home / Self Care | Source: Ambulatory Visit | Attending: Cardiology | Admitting: Cardiology

## 2024-07-18 ENCOUNTER — Ambulatory Visit: Payer: Self-pay | Admitting: Cardiology

## 2024-07-18 DIAGNOSIS — I340 Nonrheumatic mitral (valve) insufficiency: Secondary | ICD-10-CM | POA: Insufficient documentation

## 2024-07-18 LAB — ECHOCARDIOGRAM COMPLETE
AR max vel: 2.12 cm2
AV Area VTI: 2.41 cm2
AV Area mean vel: 2.19 cm2
AV Mean grad: 4 mmHg
AV Peak grad: 8.9 mmHg
Ao pk vel: 1.49 m/s
Area-P 1/2: 2.59 cm2
Calc EF: 66.7 %
MV M vel: 3.42 m/s
MV Peak grad: 46.8 mmHg
S' Lateral: 2.7 cm
Single Plane A2C EF: 66.8 %
Single Plane A4C EF: 65.3 %

## 2024-08-01 ENCOUNTER — Other Ambulatory Visit (HOSPITAL_BASED_OUTPATIENT_CLINIC_OR_DEPARTMENT_OTHER): Payer: Self-pay

## 2024-08-01 ENCOUNTER — Other Ambulatory Visit: Payer: Self-pay | Admitting: Cardiology

## 2024-08-01 MED ORDER — HYDROCHLOROTHIAZIDE 12.5 MG PO CAPS
12.5000 mg | ORAL_CAPSULE | Freq: Every day | ORAL | 2 refills | Status: AC
  Filled 2024-08-01: qty 90, 90d supply, fill #0
  Filled 2024-09-29 – 2024-10-17 (×2): qty 90, 90d supply, fill #1

## 2024-08-11 ENCOUNTER — Other Ambulatory Visit: Payer: Self-pay | Admitting: Medical Genetics

## 2024-08-11 DIAGNOSIS — Z006 Encounter for examination for normal comparison and control in clinical research program: Secondary | ICD-10-CM

## 2024-09-01 ENCOUNTER — Other Ambulatory Visit (HOSPITAL_BASED_OUTPATIENT_CLINIC_OR_DEPARTMENT_OTHER): Payer: Self-pay | Admitting: Obstetrics & Gynecology

## 2024-09-01 DIAGNOSIS — Z1231 Encounter for screening mammogram for malignant neoplasm of breast: Secondary | ICD-10-CM

## 2024-09-02 DIAGNOSIS — H60541 Acute eczematoid otitis externa, right ear: Secondary | ICD-10-CM | POA: Diagnosis not present

## 2024-09-02 DIAGNOSIS — H6123 Impacted cerumen, bilateral: Secondary | ICD-10-CM | POA: Diagnosis not present

## 2024-09-06 ENCOUNTER — Ambulatory Visit (HOSPITAL_BASED_OUTPATIENT_CLINIC_OR_DEPARTMENT_OTHER)
Admission: RE | Admit: 2024-09-06 | Discharge: 2024-09-06 | Disposition: A | Discharge status: Home / Self Care | Source: Ambulatory Visit | Attending: Obstetrics & Gynecology | Admitting: Obstetrics & Gynecology

## 2024-09-06 ENCOUNTER — Encounter (HOSPITAL_BASED_OUTPATIENT_CLINIC_OR_DEPARTMENT_OTHER): Payer: Self-pay

## 2024-09-06 DIAGNOSIS — Z1231 Encounter for screening mammogram for malignant neoplasm of breast: Secondary | ICD-10-CM | POA: Diagnosis not present

## 2024-09-09 ENCOUNTER — Other Ambulatory Visit (HOSPITAL_BASED_OUTPATIENT_CLINIC_OR_DEPARTMENT_OTHER): Payer: Self-pay

## 2024-09-12 ENCOUNTER — Other Ambulatory Visit (HOSPITAL_BASED_OUTPATIENT_CLINIC_OR_DEPARTMENT_OTHER): Payer: Self-pay

## 2024-09-12 ENCOUNTER — Other Ambulatory Visit: Payer: Self-pay | Admitting: Cardiology

## 2024-09-12 MED ORDER — TELMISARTAN 80 MG PO TABS
80.0000 mg | ORAL_TABLET | Freq: Every day | ORAL | 3 refills | Status: AC
  Filled 2024-09-12: qty 90, 90d supply, fill #0

## 2024-09-24 ENCOUNTER — Telehealth: Admitting: Family Medicine

## 2024-09-24 DIAGNOSIS — R399 Unspecified symptoms and signs involving the genitourinary system: Secondary | ICD-10-CM

## 2024-09-24 MED ORDER — NITROFURANTOIN MONOHYD MACRO 100 MG PO CAPS: 100.0000 mg | ORAL_CAPSULE | Freq: Two times a day (BID) | ORAL | 0 refills | Status: AC

## 2024-09-24 NOTE — Patient Instructions (Signed)
 Earlee Heiny, thank you for joining Roosvelt Mater, PA-C for today's virtual visit.  While this provider is not your primary care provider (PCP), if your PCP is located in our provider database this encounter information will be shared with them immediately following your visit.   A [place] MyChart account gives you access to today's visit and all your visits, tests, and labs performed at [facility]  click here if you don't have a [facility] MyChart account or go to [email or web address]  Consent: (Patient) Lindsay Burke provided verbal consent for this virtual visit at the beginning of the encounter.  Current Medications:  Current Outpatient Medications:    nitrofurantoin , macrocrystal-monohydrate, (MACROBID ) 100 MG capsule, Take 1 capsule (100 mg total) by mouth 2 (two) times daily for 2 days., Disp: 4 capsule, Rfl: 0   albuterol  (VENTOLIN  HFA) 108 (90 Base) MCG/ACT inhaler, Inhale 2 puffs into the lungs every 4 (four) hours as needed for wheezing or shortness of breath., Disp: 6.7 g, Rfl: 0   carvedilol  (COREG ) 12.5 MG tablet, Take 1 tablet (12.5 mg total) by mouth 2 (two) times daily with a meal., Disp: 180 tablet, Rfl: 3   cetirizine (ZYRTEC) 10 MG tablet, Take 10 mg by mouth daily. 0.5 TABLET AT BEDTIME, Disp: , Rfl:    fluticasone  (FLONASE) 50 MCG/ACT nasal spray, SPRAY 1 SPRAY INTO EACH NOSTRIL EVERY DAY, Disp: , Rfl:    Fluticasone -Umeclidin-Vilant (TRELEGY ELLIPTA ) 100-62.5-25 MCG/ACT AEPB, Inhale 1 puff into the lungs daily., Disp: 60 each, Rfl: 0   hydrochlorothiazide  (MICROZIDE ) 12.5 MG capsule, Take 1 capsule (12.5 mg total) by mouth daily., Disp: 90 capsule, Rfl: 2   hydrocortisone  (ANUSOL -HC) 25 MG suppository, Place 1 suppository (25 mg total) rectally 2 (two) times daily for 10 days., Disp: 20 suppository, Rfl: 0   methocarbamol  (ROBAXIN ) 500 MG tablet, Take 1 tablet (500 mg total) by mouth 3 (three) times daily as needed for muscle spasms., Disp: 90 tablet,  Rfl: 2   metroNIDAZOLE  (METROCREAM ) 0.75 % cream, Apply to affected area(s) of the face twice daily, Disp: 45 g, Rfl: 6   norethindrone  (AYGESTIN ) 5 MG tablet, Take 1 tablet (5 mg total) by mouth daily. Patient must schedule an annual exam before future refills can be given., Disp: 90 tablet, Rfl: 3   rosuvastatin  (CRESTOR ) 5 MG tablet, Take 1 tablet (5 mg total) by mouth daily., Disp: 90 tablet, Rfl: 2   telmisartan  (MICARDIS ) 80 MG tablet, Take 1 tablet (80 mg total) by mouth daily., Disp: 90 tablet, Rfl: 3   tretinoin  (RETIN-A ) 0.025 % cream, Apply a pea-sized amount topically to entire face at bedtime., Disp: 20 g, Rfl: 3   Medications ordered in this encounter:  Meds ordered this encounter  Medications   nitrofurantoin , macrocrystal-monohydrate, (MACROBID ) 100 MG capsule    Sig: Take 1 capsule (100 mg total) by mouth 2 (two) times daily for 2 days.    Dispense:  4 capsule    Refill:  0     *If you need refills on other medications prior to your next appointment, please contact your pharmacy*  Follow-Up: Call back or seek an in-person evaluation if the symptoms worsen or if the condition fails to improve as anticipated.  [facility] [phone]  Other Instructions Urinary Tract Infection, Female A urinary tract infection (UTI) is an infection in your urinary tract. The urinary tract is made up of organs that make, store, and get rid of pee (urine) in your body. These organs include:  The kidneys. The ureters. The bladder. The urethra. What are the causes? Most UTIs are caused by germs called bacteria. They may be in or near your genitals. These germs grow and cause swelling in your urinary tract. What increases the risk? You're more likely to get a UTI if: You're a female. The urethra is shorter in females than in males. You have a soft tube called a catheter that drains your pee. You can't control when you pee or poop. You have trouble peeing because of: A  kidney stone. A urinary blockage. A nerve condition that affects your bladder. Not getting enough to drink. You're sexually active. You use a birth control inside your vagina, like spermicide. You're pregnant. You have low levels of the hormone estrogen in your body. You're an older adult. You're also more likely to get a UTI if you have other health problems. These may include: Diabetes. A weak immune system. Your immune system is your body's defense system. Sickle cell disease. Injury of the spine. What are the signs or symptoms? Symptoms may include: Needing to pee right away. Peeing small amounts often. Pain or burning when you pee. Blood in your pee. Pee that smells bad or odd. Pain in your belly or lower back. You may also: Feel confused. This may be the first symptom in older adults. Vomit. Not feel hungry. Feel tired or easily annoyed. Have a fever or chills. How is this diagnosed? A UTI is diagnosed based on your medical history and an exam. You may also have other tests. These may include: Pee tests. Blood tests. Tests for sexually transmitted infections (STIs). If you've had more than one UTI, you may need to have imaging studies done to find out why you keep getting them. How is this treated? A UTI can be treated by: Taking antibiotics or other medicines. Drinking enough fluid to keep your pee pale yellow. In rare cases, a UTI can cause a very bad condition called sepsis. Sepsis may be treated in the hospital. Follow these instructions at home: Medicines Take your medicines only as told by your health care provider. If you were given antibiotics, take them as told by your provider. Do not stop taking them even if you start to feel better. General instructions Make sure you: Pee often and fully. Do not hold your pee for a long time. Wipe from front to back after you pee or poop. Use each tissue only once when you wipe. Pee after you have sex. Do not douche  or use sprays or powders in your genital area. Contact a health care provider if: Your symptoms don't get better after 1-2 days of taking antibiotics. Your symptoms go away and then come back. You have a fever or chills. You vomit or feel like you may vomit. Get help right away if: You have very bad pain in your back or lower belly. You faint. This information is not intended to replace advice given to you by your health care provider. Make sure you discuss any questions you have with your health care provider. Document Revised: 09/09/2023 Document Reviewed: 01/02/2023 Elsevier Patient Education  [employer].   If you have been instructed to have an in-person evaluation today at a local Urgent Care facility, please use the link below. It will take you to a list of all of our available [facility], including address, phone number and hours of operation. Please do not delay care.  [facility]  If you  or a family member do not have a primary care provider, use the link below to schedule a visit and establish care. When you choose a [facility] primary care physician or advanced practice provider, you gain a long-term partner in health. Find a Primary Care Provider  Learn more about [facility]'s in-office and virtual care options: [facility] - Get Care Now

## 2024-09-24 NOTE — Progress Notes (Signed)
 Virtual Visit Consent   Madhavi Hamblen, you are scheduled for a virtual visit with a [facility] provider today. Just as with appointments in the office, your consent must be obtained to participate. Your consent will be active for this visit and any virtual visit you may have with one of our providers in the next 365 days. If you have a MyChart account, a copy of this consent can be sent to you electronically.  As this is a virtual visit, video technology does not allow for your provider to perform a traditional examination. This may limit your provider's ability to fully assess your condition. If your provider identifies any concerns that need to be evaluated in person or the need to arrange testing (such as labs, EKG, etc.), we will make arrangements to do so. Although advances in technology are sophisticated, we cannot ensure that it will always work on either your end or our end. If the connection with a video visit is poor, the visit may have to be switched to a telephone visit. With either a video or telephone visit, we are not always able to ensure that we have a secure connection.  By engaging in this virtual visit, you consent to the provision of healthcare and authorize for your insurance to be billed (if applicable) for the services provided during this visit. Depending on your insurance coverage, you may receive a charge related to this service.  I need to obtain your verbal consent now. Are you willing to proceed with your visit today? Debroah Castronova has provided verbal consent on 09/24/2024 for a virtual visit (video or telephone). Lindsay Burke, [place]  Date: 09/24/2024 3:22 PM   Virtual Visit via Video Note   I, Lindsay Burke, connected with  Milyn Stapleton  ([id number], [birth year]) on 09/24/2024 at  3:15 PM EST by a video-enabled telemedicine application and verified that I am speaking with the correct person using two identifiers.  Location: Patient: Virtual Visit Location Patient:  Home Provider: Virtual Visit Location Provider: Home Office   I discussed the limitations of evaluation and management by telemedicine and the availability of in person appointments. The patient expressed understanding and agreed to proceed.    History of Present Illness: Lindsay Burke is a 53 y.o. who identifies as a female who was assigned female at birth, and is being seen today for c/o states she started to have UTI symptoms that started the night before last.  Pt states started taking left over Macrobid  and had three days of the medication and is needing to extend the prescription for the full 5 days.  She has noticed improvement since taking the Macrobid .   HPI: HPI  Problems:  Patient Active Problem List   Diagnosis Date Noted   Mixed dyslipidemia 04/03/2023   Moderate mitral regurgitation 04/03/2023   Hyperlipidemia 04/03/2023   Family history of coronary artery disease 04/03/2023   Overweight (BMI 25.0-29.9) 04/03/2023   Hypertension 04/02/2023   Asthma 08/22/2021   Palpitations 08/28/2020   Dyspnea due to COVID-19 12/21/2019   COVID-19 10/25/2019   Essential hypertension 09/05/2019   Malaise and fatigue 09/05/2019   Cardiac risk counseling 09/05/2019   Snores 09/05/2019   Mild intermittent asthma with acute exacerbation 07/09/2018   Vitamin D deficiency 07/09/2018   Fibrocystic breast changes, left 11/09/2017   Hashimoto's disease 06/19/2017   Abnormal uterine bleeding 03/12/2016   History of endometrial ablation 03/12/2016   Menorrhagia with irregular cycle 03/12/2016   Thyroid  disorder 03/12/2016   Postcoital bleeding 03/12/2016  Abnormality of left breast on screening mammogram 12/03/2015   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  Resting comfortably at home.  Head is normocephalic, atraumatic.  No labored breathing.  Speech is clear and coherent with logical content.  Patient is alert and oriented at baseline.   Assessment and Plan: 1. UTI symptoms (Primary) - nitrofurantoin , macrocrystal-monohydrate, (MACROBID ) 100 MG capsule; Take 1 capsule (100 mg total) by mouth 2 (two) times daily for 2 days.  Dispense: 4 capsule; Refill: 0  -Continue Macrobid  -Pt advised to be seen in person for persistent symptoms to further evaluate her UTI -Pt verbalized understanding  Follow Up Instructions: I discussed the assessment and treatment plan with the patient. The patient was provided an opportunity to ask questions and all were answered. The patient agreed with the plan and demonstrated an understanding of the instructions.  A copy of instructions were sent to the patient via MyChart unless otherwise noted below.    The patient was advised to call back or seek an in-person evaluation if the symptoms worsen or if the condition fails to improve as anticipated.    Lindsay Mater, PA-C    [1]  Allergies Allergen Reactions   Acetaminophen Hypertension, Other (See Comments) and Rash    Tylenol arthritis puts into CHF    Clindamycin/Lincomycin Diarrhea   Hydralazine  Shortness Of Breath and Swelling   Phentermine Palpitations and Hypertension   Chlorthalidone Hypertension        Nifedipine Rash and Hypertension   Tramadol  Other (See Comments)   Clonidine Other (See Comments)    NIGHTMARES, SLEEPY   Rosuvastatin  Other (See Comments)    Muscle pain   Spironolactone  Hypertension   Zetia  [Ezetimibe ] Other (See Comments)    Muscle pain   Aleve [Naproxen]  Palpitations    HR 120   Cephalexin  Rash  [2]  Current Outpatient Medications:    nitrofurantoin , macrocrystal-monohydrate, (MACROBID ) 100 MG capsule, Take 1 capsule (100 mg total) by mouth 2 (two) times daily for 2 days., Disp: 4 capsule, Rfl: 0   albuterol  (VENTOLIN  HFA) 108 (90 Base) MCG/ACT inhaler, Inhale 2 puffs into the lungs every 4 (four) hours as needed for wheezing or shortness of breath., Disp: 6.7 g, Rfl: 0   carvedilol  (COREG ) 12.5 MG tablet, Take 1 tablet (12.5 mg total) by mouth 2 (two) times daily with a meal., Disp: 180 tablet, Rfl: 3   cetirizine (ZYRTEC) 10 MG tablet, Take 10 mg by mouth daily. 0.5 TABLET AT BEDTIME, Disp: , Rfl:    fluticasone  (FLONASE) 50 MCG/ACT nasal spray, SPRAY 1 SPRAY INTO EACH NOSTRIL EVERY DAY, Disp: , Rfl:    Fluticasone -Umeclidin-Vilant (TRELEGY ELLIPTA ) 100-62.5-25 MCG/ACT AEPB, Inhale 1 puff into the lungs daily., Disp: 60 each, Rfl: 0   hydrochlorothiazide  (MICROZIDE ) 12.5 MG capsule, Take 1 capsule (12.5 mg total) by mouth daily., Disp: 90 capsule, Rfl: 2   hydrocortisone  (ANUSOL -HC) 25 MG suppository, Place 1 suppository (25 mg total) rectally 2 (two) times daily for 10 days., Disp: 20 suppository, Rfl: 0   methocarbamol  (ROBAXIN ) 500 MG tablet, Take 1 tablet (500  mg total) by mouth 3 (three) times daily as needed for muscle spasms., Disp: 90 tablet, Rfl: 2   metroNIDAZOLE  (METROCREAM ) 0.75 % cream, Apply to affected area(s) of the face twice daily, Disp: 45 g, Rfl: 6   norethindrone  (AYGESTIN ) 5 MG tablet, Take 1 tablet (5 mg total) by mouth daily. Patient must schedule an annual exam before future refills can be given., Disp: 90 tablet, Rfl: 3   rosuvastatin  (CRESTOR ) 5 MG tablet, Take 1 tablet (5 mg total) by mouth daily., Disp: 90 tablet, Rfl: 2   telmisartan  (MICARDIS ) 80 MG tablet, Take 1 tablet (80 mg total) by mouth daily., Disp: 90 tablet, Rfl: 3   tretinoin  (RETIN-A ) 0.025 % cream, Apply a pea-sized amount topically to entire face at  bedtime., Disp: 20 g, Rfl: 3

## 2024-09-29 ENCOUNTER — Other Ambulatory Visit (HOSPITAL_BASED_OUTPATIENT_CLINIC_OR_DEPARTMENT_OTHER): Payer: Self-pay

## 2024-09-29 ENCOUNTER — Other Ambulatory Visit: Payer: Self-pay

## 2024-10-01 ENCOUNTER — Telehealth: Admitting: Family Medicine

## 2024-10-01 ENCOUNTER — Encounter

## 2024-10-01 DIAGNOSIS — J4521 Mild intermittent asthma with (acute) exacerbation: Secondary | ICD-10-CM | POA: Diagnosis not present

## 2024-10-01 MED ORDER — PREDNISONE 10 MG (21) PO TBPK: ORAL_TABLET | ORAL | 0 refills | Status: AC

## 2024-10-01 MED ORDER — AZITHROMYCIN 250 MG PO TABS: ORAL_TABLET | ORAL | 0 refills | Status: AC

## 2024-10-01 NOTE — Patient Instructions (Signed)
 " Lindsay Burke, thank you for joining Roosvelt Mater, PA-C for today's virtual visit.  While this provider is not your primary care provider (PCP), if your PCP is located in our provider database this encounter information will be shared with them immediately following your visit.   A [facility] MyChart account gives you access to today's visit and all your visits, tests, and labs performed at [facility]  click here if you don't have a [facility] MyChart account or go to [email or web address]  Consent: (Patient) Lindsay Burke provided verbal consent for this virtual visit at the beginning of the encounter.  Current Medications:  Current Outpatient Medications:    azithromycin  (ZITHROMAX ) 250 MG tablet, Take 2 tablets on day 1, then 1 tablet daily on days 2 through 5, Disp: 6 tablet, Rfl: 0   predniSONE  (STERAPRED UNI-PAK 21 TAB) 10 MG (21) TBPK tablet, Take following package directions., Disp: 21 tablet, Rfl: 0   albuterol  (VENTOLIN  HFA) 108 (90 Base) MCG/ACT inhaler, Inhale 2 puffs into the lungs every 4 (four) hours as needed for wheezing or shortness of breath., Disp: 6.7 g, Rfl: 0   carvedilol  (COREG ) 12.5 MG tablet, Take 1 tablet (12.5 mg total) by mouth 2 (two) times daily with a meal., Disp: 180 tablet, Rfl: 3   cetirizine (ZYRTEC) 10 MG tablet, Take 10 mg by mouth daily. 0.5 TABLET AT BEDTIME, Disp: , Rfl:    fluticasone  (FLONASE) 50 MCG/ACT nasal spray, SPRAY 1 SPRAY INTO EACH NOSTRIL EVERY DAY, Disp: , Rfl:    Fluticasone -Umeclidin-Vilant (TRELEGY ELLIPTA ) 100-62.5-25 MCG/ACT AEPB, Inhale 1 puff into the lungs daily., Disp: 60 each, Rfl: 0   hydrochlorothiazide  (MICROZIDE ) 12.5 MG capsule, Take 1 capsule (12.5 mg total) by mouth daily., Disp: 90 capsule, Rfl: 2   hydrocortisone  (ANUSOL -HC) 25 MG suppository, Place 1 suppository (25 mg total) rectally 2 (two) times daily for 10 days., Disp: 20 suppository, Rfl: 0   methocarbamol  (ROBAXIN ) 500 MG tablet, Take 1 tablet (500  mg total) by mouth 3 (three) times daily as needed for muscle spasms., Disp: 90 tablet, Rfl: 2   metroNIDAZOLE  (METROCREAM ) 0.75 % cream, Apply to affected area(s) of the face twice daily, Disp: 45 g, Rfl: 6   norethindrone  (AYGESTIN ) 5 MG tablet, Take 1 tablet (5 mg total) by mouth daily. Patient must schedule an annual exam before future refills can be given., Disp: 90 tablet, Rfl: 3   rosuvastatin  (CRESTOR ) 5 MG tablet, Take 1 tablet (5 mg total) by mouth daily., Disp: 90 tablet, Rfl: 2   telmisartan  (MICARDIS ) 80 MG tablet, Take 1 tablet (80 mg total) by mouth daily., Disp: 90 tablet, Rfl: 3   tretinoin  (RETIN-A ) 0.025 % cream, Apply a pea-sized amount topically to entire face at bedtime., Disp: 20 g, Rfl: 3   Medications ordered in this encounter:  Meds ordered this encounter  Medications   azithromycin  (ZITHROMAX ) 250 MG tablet    Sig: Take 2 tablets on day 1, then 1 tablet daily on days 2 through 5    Dispense:  6 tablet    Refill:  0   predniSONE  (STERAPRED UNI-PAK 21 TAB) 10 MG (21) TBPK tablet    Sig: Take following package directions.    Dispense:  21 tablet    Refill:  0    Please dispense one standard blister pack taper.     *If you need refills on other medications prior to your next appointment, please contact your pharmacy*  Follow-Up: Call back or seek an in-person  evaluation if the symptoms worsen or if the condition fails to improve as anticipated.  [facility] [phone]  Other Instructions Acute Bronchitis, Adult  Acute bronchitis is sudden inflammation of the main airways (bronchi) that come off the windpipe (trachea) in the lungs. The swelling causes the airways to get smaller and make more mucus than normal. This can make it hard to breathe and can cause coughing or noisy breathing (wheezing). Acute bronchitis may last several weeks. The cough may last longer. Allergies, asthma, and exposure to smoke may make the condition worse. What are the  causes? This condition can be caused by germs and by substances that irritate the lungs, including: Cold and flu viruses. The most common cause of this condition is the virus that causes the common cold. Bacteria. This is less common. Breathing in substances that irritate the lungs, including: Smoke from cigarettes and other forms of tobacco. Dust and pollen. Fumes from household cleaning products, gases, or burned fuel. Indoor or outdoor air pollution. What increases the risk? The following factors may make you more likely to develop this condition: A weak body's defense system, also called the immune system. A condition that affects your lungs and breathing, such as asthma. What are the signs or symptoms? Common symptoms of this condition include: Coughing. This may bring up clear, yellow, or green mucus from your lungs (sputum). Wheezing. Runny or stuffy nose. Having too much mucus in your lungs (chest congestion). Shortness of breath. Aches and pains, including sore throat or chest. How is this diagnosed? This condition is usually diagnosed based on: Your symptoms and medical history. A physical exam. You may also have other tests, including tests to rule out other conditions, such as pneumonia. These tests include: A test of lung function. Test of a mucus sample to look for the presence of bacteria. Tests to check the oxygen level in your blood. Blood tests. Chest X-ray. How is this treated? Most cases of acute bronchitis clear up over time without treatment. Your health care provider may recommend: Drinking more fluids to help thin your mucus so it is easier to cough up. Taking inhaled medicine (inhaler) to improve air flow in and out of your lungs. Using a vaporizer or a humidifier. These are machines that add water to the air to help you breathe better. Taking a medicine that thins mucus and clears congestion (expectorant). Taking a medicine that prevents or stops  coughing (cough suppressant). It is not common to take an antibiotic medicine for this condition. Follow these instructions at home:  Take over-the-counter and prescription medicines only as told by your health care provider. Use an inhaler, vaporizer, or humidifier as told by your health care provider. Take two teaspoons (10 mL) of honey at bedtime to lessen coughing at night. Drink enough fluid to keep your urine pale yellow. Do not use any products that contain nicotine or tobacco. These products include cigarettes, chewing tobacco, and vaping devices, such as e-cigarettes. If you need help quitting, ask your health care provider. Get plenty of rest. Return to your normal activities as told by your health care provider. Ask your health care provider what activities are safe for you. Keep all follow-up visits. This is important. How is this prevented? To lower your risk of getting this condition again: Wash your hands often with soap and water for at least 20 seconds. If soap and water are not available, use hand sanitizer. Avoid contact with people who have cold symptoms. Try  not to touch your mouth, nose, or eyes with your hands. Avoid breathing in smoke or chemical fumes. Breathing smoke or chemical fumes will make your condition worse. Get the flu shot every year. Contact a health care provider if: Your symptoms do not improve after 2 weeks. You have trouble coughing up the mucus. Your cough keeps you awake at night. You have a fever. Get help right away if you: Cough up blood. Feel pain in your chest. Have severe shortness of breath. Faint or keep feeling like you are going to faint. Have a severe headache. Have a fever or chills that get worse. These symptoms may represent a serious problem that is an emergency. Do not wait to see if the symptoms will go away. Get medical help right away. Call your local emergency services (911 in the [place] Do not drive yourself to the  hospital. Summary Acute bronchitis is inflammation of the main airways (bronchi) that come off the windpipe (trachea) in the lungs. The swelling causes the airways to get smaller and make more mucus than normal. Drinking more fluids can help thin your mucus so it is easier to cough up. Take over-the-counter and prescription medicines only as told by your health care provider. Do not use any products that contain nicotine or tobacco. These products include cigarettes, chewing tobacco, and vaping devices, such as e-cigarettes. If you need help quitting, ask your health care provider. Contact a health care provider if your symptoms do not improve after 2 weeks. This information is not intended to replace advice given to you by your health care provider. Make sure you discuss any questions you have with your health care provider. Document Revised: 01/09/2022 Document Reviewed: 01/30/2021 Elsevier Patient Education  2024 Elsevier Inc.   If you have been instructed to have an in-person evaluation today at a local Urgent Care facility, please use the link below. It will take you to a list of all of our available [facility], including address, phone number and hours of operation. Please do not delay care.  [facility]  If you or a family member do not have a primary care provider, use the link below to schedule a visit and establish care. When you choose a [facility] primary care physician or advanced practice provider, you gain a long-term partner in health. Find a Primary Care Provider  Learn more about [facility]'s in-office and virtual care options: [facility] Get Care Now  "

## 2024-10-01 NOTE — Progress Notes (Signed)
 " Virtual Visit Consent   Lindsay Burke, you are scheduled for a virtual visit with a [facility] provider today. Just as with appointments in the office, your consent must be obtained to participate. Your consent will be active for this visit and any virtual visit you may have with one of our providers in the next 365 days. If you have a MyChart account, a copy of this consent can be sent to you electronically.  As this is a virtual visit, video technology does not allow for your provider to perform a traditional examination. This may limit your provider's ability to fully assess your condition. If your provider identifies any concerns that need to be evaluated in person or the need to arrange testing (such as labs, EKG, etc.), we will make arrangements to do so. Although advances in technology are sophisticated, we cannot ensure that it will always work on either your end or our end. If the connection with a video visit is poor, the visit may have to be switched to a telephone visit. With either a video or telephone visit, we are not always able to ensure that we have a secure connection.  By engaging in this virtual visit, you consent to the provision of healthcare and authorize for your insurance to be billed (if applicable) for the services provided during this visit. Depending on your insurance coverage, you may receive a charge related to this service.  I need to obtain your verbal consent now. Are you willing to proceed with your visit today? Lindsay Burke has provided verbal consent on 10/01/2024 for a virtual visit (video or telephone). Lindsay Burke, [place]  Date: 10/01/2024 4:22 PM   Virtual Visit via Video Note   I, Lindsay Burke, connected with  Mark Benecke  ([id number], 1971/01/16) on 10/01/2024 at  4:15 PM EST by a video-enabled telemedicine application and verified that I am speaking with the correct person using two identifiers.  Location: Patient: Virtual Visit Location Patient:  Home Provider: Virtual Visit Location Provider: Home Office   I discussed the limitations of evaluation and management by telemedicine and the availability of in person appointments. The patient expressed understanding and agreed to proceed.    History of Present Illness: Charlize Hathaway is a 53 y.o. who identifies as a female who was assigned female at birth, and is being seen today for c/o states she was exposed to mycoplasma from her son who has asthma.  Pt states she also has asthma and is currently have an asthma flare.  Pt states her symptoms started last Saturday.  Pt states she is using all her inhalers but they are not as helpful and feels like the albuterol  effects are running out early.   HPI: HPI  Problems:  Patient Active Problem List   Diagnosis Date Noted   Mixed dyslipidemia 04/03/2023   Moderate mitral regurgitation 04/03/2023   Hyperlipidemia 04/03/2023   Family history of coronary artery disease 04/03/2023   Overweight (BMI 25.0-29.9) 04/03/2023   Hypertension 04/02/2023   Asthma 08/22/2021   Palpitations 08/28/2020   Dyspnea due to COVID-19 12/21/2019   COVID-19 10/25/2019   Essential hypertension 09/05/2019   Malaise and fatigue 09/05/2019   Cardiac risk counseling 09/05/2019   Snores 09/05/2019   Mild intermittent asthma with acute exacerbation 07/09/2018   Vitamin D deficiency 07/09/2018   Fibrocystic breast changes, left 11/09/2017   Hashimoto's disease 06/19/2017   Abnormal uterine bleeding 03/12/2016   History of endometrial ablation 03/12/2016   Menorrhagia with irregular cycle  03/12/2016   Thyroid  disorder 03/12/2016   Postcoital bleeding 03/12/2016   Abnormality of left breast on screening mammogram 12/03/2015   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  Resting comfortably at home.  Head is normocephalic, atraumatic.  No labored breathing.  Speech is clear and coherent with logical content.  Patient is alert and oriented at baseline.    Assessment and Plan: 1. Mild intermittent asthma with exacerbation (Primary) - azithromycin  (ZITHROMAX ) 250 MG tablet; Take 2 tablets on day 1, then 1 tablet daily on days 2 through 5  Dispense: 6 tablet; Refill: 0 - predniSONE  (STERAPRED UNI-PAK 21 TAB) 10 MG (21) TBPK tablet; Take following package directions.  Dispense: 21 tablet; Refill: 0  -Pt to follow up in person for worsening or continual symptoms  Follow Up Instructions: I discussed the assessment and treatment plan with the patient. The patient was provided an opportunity to ask questions and all were answered. The patient agreed with the plan and demonstrated an understanding of the instructions.  A copy of instructions were sent to the patient via MyChart unless otherwise noted below.    The patient was advised to call back or seek an in-person evaluation if the symptoms worsen or if the condition fails to improve as anticipated.    Lindsay Mater, PA-C    [1]  Allergies Allergen Reactions   Acetaminophen Hypertension, Other (See Comments) and Rash    Tylenol arthritis puts into CHF    Clindamycin/Lincomycin Diarrhea   Hydralazine  Shortness Of Breath and Swelling   Phentermine Palpitations and Hypertension   Chlorthalidone Hypertension        Nifedipine Rash and Hypertension   Tramadol  Other (See Comments)   Clonidine Other (See Comments)    NIGHTMARES, SLEEPY   Rosuvastatin  Other (See Comments)    Muscle pain   Spironolactone   Hypertension   Zetia  [Ezetimibe ] Other (See Comments)    Muscle pain   Aleve [Naproxen] Palpitations    HR 120   Cephalexin  Rash  [2]  Current Outpatient Medications:    azithromycin  (ZITHROMAX ) 250 MG tablet, Take 2 tablets on day 1, then 1 tablet daily on days 2 through 5, Disp: 6 tablet, Rfl: 0   predniSONE  (STERAPRED UNI-PAK 21 TAB) 10 MG (21) TBPK tablet, Take following package directions., Disp: 21 tablet, Rfl: 0   albuterol  (VENTOLIN  HFA) 108 (90 Base) MCG/ACT inhaler, Inhale 2 puffs into the lungs every 4 (four) hours as needed for wheezing or shortness of breath., Disp: 6.7 g, Rfl: 0   carvedilol  (COREG ) 12.5 MG tablet, Take 1 tablet (12.5 mg total) by mouth 2 (two) times daily with a meal., Disp: 180 tablet, Rfl: 3   cetirizine (ZYRTEC) 10 MG tablet, Take 10 mg by mouth daily. 0.5 TABLET AT BEDTIME, Disp: , Rfl:    fluticasone  (FLONASE) 50 MCG/ACT nasal spray, SPRAY 1 SPRAY INTO EACH NOSTRIL EVERY DAY, Disp: , Rfl:    Fluticasone -Umeclidin-Vilant (TRELEGY ELLIPTA ) 100-62.5-25 MCG/ACT AEPB, Inhale 1 puff into the lungs daily., Disp: 60 each, Rfl: 0   hydrochlorothiazide  (MICROZIDE ) 12.5 MG capsule, Take 1 capsule (  12.5 mg total) by mouth daily., Disp: 90 capsule, Rfl: 2   hydrocortisone  (ANUSOL -HC) 25 MG suppository, Place 1 suppository (25 mg total) rectally 2 (two) times daily for 10 days., Disp: 20 suppository, Rfl: 0   methocarbamol  (ROBAXIN ) 500 MG tablet, Take 1 tablet (500 mg total) by mouth 3 (three) times daily as needed for muscle spasms., Disp: 90 tablet, Rfl: 2   metroNIDAZOLE  (METROCREAM ) 0.75 % cream, Apply to affected area(s) of the face twice daily, Disp: 45 g, Rfl: 6   norethindrone  (AYGESTIN ) 5 MG tablet, Take 1 tablet (5 mg total) by mouth daily. Patient must schedule an annual exam before future refills can be given., Disp: 90 tablet, Rfl: 3   rosuvastatin  (CRESTOR ) 5 MG tablet, Take 1 tablet (5 mg total) by mouth daily., Disp: 90 tablet, Rfl: 2   telmisartan   (MICARDIS ) 80 MG tablet, Take 1 tablet (80 mg total) by mouth daily., Disp: 90 tablet, Rfl: 3   tretinoin  (RETIN-A ) 0.025 % cream, Apply a pea-sized amount topically to entire face at bedtime., Disp: 20 g, Rfl: 3  "
